# Patient Record
Sex: Female | Born: 1950
Health system: Southern US, Community
[De-identification: ages and names within clinical notes are randomized; demographics above are authoritative.]

## PROBLEM LIST (undated history)

## (undated) DIAGNOSIS — I10 Essential (primary) hypertension: Secondary | ICD-10-CM

## (undated) DIAGNOSIS — K219 Gastro-esophageal reflux disease without esophagitis: Secondary | ICD-10-CM

## (undated) DIAGNOSIS — E876 Hypokalemia: Secondary | ICD-10-CM

---

## 2000-01-15 ENCOUNTER — Emergency Department (HOSPITAL_COMMUNITY): Admission: EM | Admit: 2000-01-15 | Discharge: 2000-01-15 | Payer: Self-pay | Admitting: Emergency Medicine

## 2000-08-12 ENCOUNTER — Other Ambulatory Visit: Admission: RE | Admit: 2000-08-12 | Discharge: 2000-08-12 | Payer: Self-pay | Admitting: *Deleted

## 2005-03-13 ENCOUNTER — Observation Stay (HOSPITAL_COMMUNITY): Admission: EM | Admit: 2005-03-13 | Discharge: 2005-03-14 | Payer: Self-pay | Admitting: Emergency Medicine

## 2005-04-17 ENCOUNTER — Encounter: Admission: RE | Admit: 2005-04-17 | Discharge: 2005-05-19 | Payer: Self-pay | Admitting: Orthopedic Surgery

## 2005-06-01 ENCOUNTER — Encounter: Admission: RE | Admit: 2005-06-01 | Discharge: 2005-06-18 | Payer: Self-pay | Admitting: Orthopedic Surgery

## 2006-08-20 ENCOUNTER — Inpatient Hospital Stay (HOSPITAL_COMMUNITY): Admission: AD | Admit: 2006-08-20 | Discharge: 2006-08-20 | Payer: Self-pay | Admitting: Obstetrics & Gynecology

## 2006-09-06 ENCOUNTER — Ambulatory Visit: Payer: Self-pay | Admitting: Internal Medicine

## 2006-09-21 ENCOUNTER — Emergency Department (HOSPITAL_COMMUNITY): Admission: EM | Admit: 2006-09-21 | Discharge: 2006-09-21 | Payer: Self-pay | Admitting: Emergency Medicine

## 2007-08-10 ENCOUNTER — Emergency Department (HOSPITAL_COMMUNITY): Admission: EM | Admit: 2007-08-10 | Discharge: 2007-08-10 | Payer: Self-pay | Admitting: Emergency Medicine

## 2007-09-22 ENCOUNTER — Emergency Department (HOSPITAL_COMMUNITY): Admission: EM | Admit: 2007-09-22 | Discharge: 2007-09-22 | Payer: Self-pay | Admitting: Family Medicine

## 2007-11-01 ENCOUNTER — Emergency Department (HOSPITAL_COMMUNITY): Admission: EM | Admit: 2007-11-01 | Discharge: 2007-11-01 | Payer: Self-pay | Admitting: Emergency Medicine

## 2008-05-23 ENCOUNTER — Emergency Department (HOSPITAL_COMMUNITY): Admission: EM | Admit: 2008-05-23 | Discharge: 2008-05-23 | Payer: Self-pay | Admitting: Orthopaedic Surgery

## 2008-06-05 ENCOUNTER — Encounter (INDEPENDENT_AMBULATORY_CARE_PROVIDER_SITE_OTHER): Payer: Self-pay | Admitting: Adult Health

## 2008-06-05 ENCOUNTER — Ambulatory Visit: Payer: Self-pay | Admitting: Internal Medicine

## 2008-06-05 LAB — CONVERTED CEMR LAB
Alkaline Phosphatase: 55 units/L (ref 39–117)
BUN: 11 mg/dL (ref 6–23)
Cholesterol: 176 mg/dL (ref 0–200)
Creatinine, Ser: 0.7 mg/dL (ref 0.40–1.20)
Eosinophils Absolute: 0.1 10*3/uL (ref 0.0–0.7)
Folate: 9.5 ng/mL
Glucose, Bld: 80 mg/dL (ref 70–99)
HCT: 37.1 % (ref 36.0–46.0)
Hemoglobin: 11.5 g/dL — ABNORMAL LOW (ref 12.0–15.0)
Iron: 169 ug/dL — ABNORMAL HIGH (ref 42–145)
LDL Cholesterol: 80 mg/dL (ref 0–99)
Lymphocytes Relative: 27 % (ref 12–46)
MCHC: 31 g/dL (ref 30.0–36.0)
MCV: 98.1 fL (ref 78.0–100.0)
Monocytes Absolute: 0.4 10*3/uL (ref 0.1–1.0)
Platelets: 312 10*3/uL (ref 150–400)
Potassium: 4.4 meq/L (ref 3.5–5.3)
RBC: 3.78 M/uL — ABNORMAL LOW (ref 3.87–5.11)
Saturation Ratios: 48 % (ref 20–55)
Sodium: 143 meq/L (ref 135–145)
TIBC: 354 ug/dL (ref 250–470)
TSH: 1.063 microintl units/mL (ref 0.350–4.500)
Total Bilirubin: 0.3 mg/dL (ref 0.3–1.2)
Triglycerides: 146 mg/dL (ref <150)

## 2008-06-15 ENCOUNTER — Ambulatory Visit: Payer: Self-pay | Admitting: *Deleted

## 2008-07-11 ENCOUNTER — Ambulatory Visit: Payer: Self-pay | Admitting: Internal Medicine

## 2008-07-11 ENCOUNTER — Encounter (INDEPENDENT_AMBULATORY_CARE_PROVIDER_SITE_OTHER): Payer: Self-pay | Admitting: Adult Health

## 2008-07-18 ENCOUNTER — Ambulatory Visit (HOSPITAL_COMMUNITY): Admission: RE | Admit: 2008-07-18 | Discharge: 2008-07-18 | Payer: Self-pay | Admitting: Family Medicine

## 2008-08-10 ENCOUNTER — Emergency Department (HOSPITAL_COMMUNITY): Admission: EM | Admit: 2008-08-10 | Discharge: 2008-08-10 | Payer: Self-pay | Admitting: Emergency Medicine

## 2008-08-14 ENCOUNTER — Ambulatory Visit: Payer: Self-pay | Admitting: Internal Medicine

## 2008-08-15 ENCOUNTER — Ambulatory Visit: Payer: Self-pay | Admitting: Internal Medicine

## 2009-02-06 ENCOUNTER — Encounter (INDEPENDENT_AMBULATORY_CARE_PROVIDER_SITE_OTHER): Payer: Self-pay | Admitting: Adult Health

## 2009-02-06 ENCOUNTER — Ambulatory Visit: Payer: Self-pay | Admitting: Internal Medicine

## 2009-02-06 LAB — CONVERTED CEMR LAB
Alkaline Phosphatase: 59 units/L (ref 39–117)
BUN: 10 mg/dL (ref 6–23)
Basophils Absolute: 0 10*3/uL (ref 0.0–0.1)
Basophils Relative: 1 % (ref 0–1)
Chloride: 101 meq/L (ref 96–112)
Eosinophils Relative: 3 % (ref 0–5)
Glucose, Bld: 65 mg/dL — ABNORMAL LOW (ref 70–99)
Hemoglobin: 12.6 g/dL (ref 12.0–15.0)
Lymphocytes Relative: 30 % (ref 12–46)
Monocytes Absolute: 0.4 10*3/uL (ref 0.1–1.0)
Monocytes Relative: 9 % (ref 3–12)
Neutrophils Relative %: 58 % (ref 43–77)
RBC: 4.11 M/uL (ref 3.87–5.11)
RDW: 15 % (ref 11.5–15.5)
Total Bilirubin: 0.6 mg/dL (ref 0.3–1.2)
Total Protein: 8 g/dL (ref 6.0–8.3)

## 2009-02-08 ENCOUNTER — Encounter (INDEPENDENT_AMBULATORY_CARE_PROVIDER_SITE_OTHER): Payer: Self-pay | Admitting: Adult Health

## 2009-02-08 LAB — CONVERTED CEMR LAB
HCV Ab: NEGATIVE
Hep A IgM: NEGATIVE
Hep B C IgM: NEGATIVE
Hepatitis B Surface Ag: NEGATIVE

## 2009-05-22 ENCOUNTER — Ambulatory Visit: Payer: Self-pay | Admitting: Internal Medicine

## 2009-07-03 ENCOUNTER — Ambulatory Visit: Payer: Self-pay | Admitting: Internal Medicine

## 2009-07-03 ENCOUNTER — Encounter (INDEPENDENT_AMBULATORY_CARE_PROVIDER_SITE_OTHER): Payer: Self-pay | Admitting: Adult Health

## 2009-07-03 LAB — CONVERTED CEMR LAB
ALT: 15 units/L (ref 0–35)
AST: 24 units/L (ref 0–37)
Albumin: 4.8 g/dL (ref 3.5–5.2)
Basophils Absolute: 0 10*3/uL (ref 0.0–0.1)
Basophils Relative: 1 % (ref 0–1)
Cholesterol: 187 mg/dL (ref 0–200)
Eosinophils Absolute: 0 10*3/uL (ref 0.0–0.7)
Eosinophils Relative: 1 % (ref 0–5)
HCT: 35.9 % — ABNORMAL LOW (ref 36.0–46.0)
HDL: 68 mg/dL (ref >39)
Hemoglobin: 11.8 g/dL — ABNORMAL LOW (ref 12.0–15.0)
MCV: 92.8 fL (ref 78.0–100.0)
Monocytes Absolute: 0.2 10*3/uL (ref 0.1–1.0)
Monocytes Relative: 5 % (ref 3–12)
Neutrophils Relative %: 66 % (ref 43–77)
Platelets: 211 10*3/uL (ref 150–400)
Sodium: 138 meq/L (ref 135–145)
Total CHOL/HDL Ratio: 2.8
Total Protein: 7.9 g/dL (ref 6.0–8.3)
VLDL: 23 mg/dL (ref 0–40)

## 2009-07-08 ENCOUNTER — Encounter (INDEPENDENT_AMBULATORY_CARE_PROVIDER_SITE_OTHER): Payer: Self-pay | Admitting: Adult Health

## 2009-07-08 LAB — CONVERTED CEMR LAB: Iron: 136 ug/dL (ref 42–145)

## 2009-10-07 ENCOUNTER — Emergency Department (HOSPITAL_COMMUNITY): Admission: EM | Admit: 2009-10-07 | Discharge: 2009-10-07 | Payer: Self-pay | Admitting: Family Medicine

## 2009-10-07 ENCOUNTER — Emergency Department (HOSPITAL_COMMUNITY): Admission: EM | Admit: 2009-10-07 | Discharge: 2009-10-07 | Payer: Self-pay | Admitting: Emergency Medicine

## 2010-05-23 LAB — URINALYSIS, ROUTINE W REFLEX MICROSCOPIC
Bilirubin Urine: NEGATIVE
Glucose, UA: NEGATIVE mg/dL
Hgb urine dipstick: NEGATIVE
Ketones, ur: NEGATIVE mg/dL
Nitrite: NEGATIVE
Protein, ur: NEGATIVE mg/dL
Specific Gravity, Urine: 1.006 (ref 1.005–1.030)
Urobilinogen, UA: 0.2 mg/dL (ref 0.0–1.0)
pH: 5.5 (ref 5.0–8.0)

## 2010-05-23 LAB — CBC
HCT: 31.4 % — ABNORMAL LOW (ref 36.0–46.0)
Hemoglobin: 10.8 g/dL — ABNORMAL LOW (ref 12.0–15.0)
MCH: 32.1 pg (ref 26.0–34.0)
MCHC: 34.4 g/dL (ref 30.0–36.0)
MCV: 93.5 fL (ref 78.0–100.0)
Platelets: 217 K/uL (ref 150–400)
RBC: 3.36 MIL/uL — ABNORMAL LOW (ref 3.87–5.11)
RDW: 13.8 % (ref 11.5–15.5)
WBC: 5.3 K/uL (ref 4.0–10.5)

## 2010-05-23 LAB — BASIC METABOLIC PANEL
BUN: 33 mg/dL — ABNORMAL HIGH (ref 6–23)
CO2: 28 mEq/L (ref 19–32)
Calcium: 10.3 mg/dL (ref 8.4–10.5)
Chloride: 97 mEq/L (ref 96–112)
Creatinine, Ser: 2.37 mg/dL — ABNORMAL HIGH (ref 0.4–1.2)

## 2010-05-23 LAB — DIFFERENTIAL
Eosinophils Absolute: 0.2 10*3/uL (ref 0.0–0.7)
Lymphocytes Relative: 28 % (ref 12–46)
Lymphs Abs: 1.5 10*3/uL (ref 0.7–4.0)
Monocytes Relative: 8 % (ref 3–12)
Neutro Abs: 3.2 10*3/uL (ref 1.7–7.7)
Neutrophils Relative %: 61 % (ref 43–77)

## 2010-05-23 LAB — POCT I-STAT, CHEM 8
BUN: 36 mg/dL — ABNORMAL HIGH (ref 6–23)
Calcium, Ion: 1.19 mmol/L (ref 1.12–1.32)
Chloride: 97 meq/L (ref 96–112)
Creatinine, Ser: 2.8 mg/dL — ABNORMAL HIGH (ref 0.4–1.2)
Glucose, Bld: 75 mg/dL (ref 70–99)
HCT: 35 % — ABNORMAL LOW (ref 36.0–46.0)
Hemoglobin: 11.9 g/dL — ABNORMAL LOW (ref 12.0–15.0)
Potassium: 3 meq/L — ABNORMAL LOW (ref 3.5–5.1)
Sodium: 134 meq/L — ABNORMAL LOW (ref 135–145)
TCO2: 27 mmol/L (ref 0–100)

## 2010-05-23 LAB — POCT CARDIAC MARKERS
CKMB, poc: <1 ng/mL — ABNORMAL LOW (ref 1.0–8.0)
Myoglobin, poc: 94.2 ng/mL (ref 12–200)
Troponin i, poc: <0.05 ng/mL (ref 0.00–0.09)

## 2010-06-16 LAB — BASIC METABOLIC PANEL
CO2: 23 mEq/L (ref 19–32)
Chloride: 102 mEq/L (ref 96–112)
GFR calc Af Amer: >60 mL/min (ref >60)
Potassium: 3.1 mEq/L — ABNORMAL LOW (ref 3.5–5.1)

## 2010-06-19 LAB — URINALYSIS, ROUTINE W REFLEX MICROSCOPIC
Bilirubin Urine: NEGATIVE
Glucose, UA: NEGATIVE mg/dL
Hgb urine dipstick: NEGATIVE
Ketones, ur: NEGATIVE mg/dL
Nitrite: NEGATIVE
Urobilinogen, UA: 1 mg/dL (ref 0.0–1.0)

## 2010-06-19 LAB — BASIC METABOLIC PANEL
BUN: 8 mg/dL (ref 6–23)
Calcium: 8.8 mg/dL (ref 8.4–10.5)
Creatinine, Ser: 0.65 mg/dL (ref 0.4–1.2)
GFR calc Af Amer: >60 mL/min (ref >60)
GFR calc non Af Amer: >60 mL/min (ref >60)
Glucose, Bld: 102 mg/dL — ABNORMAL HIGH (ref 70–99)
Sodium: 139 mEq/L (ref 135–145)

## 2010-06-19 LAB — DIFFERENTIAL
Eosinophils Relative: 0 % (ref 0–5)
Lymphocytes Relative: 10 % — ABNORMAL LOW (ref 12–46)
Lymphs Abs: 0.5 10*3/uL — ABNORMAL LOW (ref 0.7–4.0)
Monocytes Absolute: 0.3 10*3/uL (ref 0.1–1.0)
Monocytes Relative: 6 % (ref 3–12)
Neutro Abs: 3.8 10*3/uL (ref 1.7–7.7)
Neutrophils Relative %: 83 % — ABNORMAL HIGH (ref 43–77)

## 2010-06-19 LAB — CBC
HCT: 33.2 % — ABNORMAL LOW (ref 36.0–46.0)
RDW: 13.6 % (ref 11.5–15.5)
WBC: 4.5 10*3/uL (ref 4.0–10.5)

## 2010-06-19 LAB — URINE MICROSCOPIC-ADD ON
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
## 2010-08-11 ENCOUNTER — Inpatient Hospital Stay (INDEPENDENT_AMBULATORY_CARE_PROVIDER_SITE_OTHER)
Admission: RE | Admit: 2010-08-11 | Discharge: 2010-08-11 | Disposition: A | Discharge status: Home / Self Care | Payer: Self-pay | Source: Ambulatory Visit | Attending: Family Medicine | Admitting: Family Medicine

## 2010-08-11 DIAGNOSIS — Z760 Encounter for issue of repeat prescription: Secondary | ICD-10-CM

## 2010-08-11 DIAGNOSIS — I10 Essential (primary) hypertension: Secondary | ICD-10-CM

## 2010-08-11 DIAGNOSIS — L2089 Other atopic dermatitis: Secondary | ICD-10-CM

## 2010-08-11 LAB — POCT I-STAT, CHEM 8
BUN: 13 mg/dL (ref 6–23)
Calcium, Ion: 1.3 mmol/L (ref 1.12–1.32)
Chloride: 107 mEq/L (ref 96–112)
Creatinine, Ser: 0.9 mg/dL (ref 0.4–1.2)
Hemoglobin: 13.9 g/dL (ref 12.0–15.0)

## 2010-12-25 ENCOUNTER — Emergency Department (HOSPITAL_COMMUNITY)
Admission: EM | Admit: 2010-12-25 | Discharge: 2010-12-26 | Disposition: A | Discharge status: Home / Self Care | Payer: Self-pay | Attending: Emergency Medicine | Admitting: Emergency Medicine

## 2010-12-25 DIAGNOSIS — I10 Essential (primary) hypertension: Secondary | ICD-10-CM | POA: Insufficient documentation

## 2010-12-25 DIAGNOSIS — J449 Chronic obstructive pulmonary disease, unspecified: Secondary | ICD-10-CM | POA: Insufficient documentation

## 2010-12-25 DIAGNOSIS — R21 Rash and other nonspecific skin eruption: Secondary | ICD-10-CM | POA: Insufficient documentation

## 2010-12-25 DIAGNOSIS — J4489 Other specified chronic obstructive pulmonary disease: Secondary | ICD-10-CM | POA: Insufficient documentation

## 2010-12-25 DIAGNOSIS — K219 Gastro-esophageal reflux disease without esophagitis: Secondary | ICD-10-CM | POA: Insufficient documentation

## 2010-12-25 LAB — COMPREHENSIVE METABOLIC PANEL
ALT: 28
AST: 56 — ABNORMAL HIGH
Alkaline Phosphatase: 62
CO2: 24
Chloride: 104
GFR calc Af Amer: >60
GFR calc non Af Amer: >60
Glucose, Bld: 199 — ABNORMAL HIGH
Potassium: 3.3 — ABNORMAL LOW
Sodium: 136
Total Bilirubin: 0.8

## 2010-12-25 LAB — CBC
Hemoglobin: 12.2
MCHC: 33.7
RBC: 3.89
WBC: 5.2

## 2010-12-25 LAB — URINALYSIS, ROUTINE W REFLEX MICROSCOPIC
Glucose, UA: 250 — AB
Hgb urine dipstick: NEGATIVE
Ketones, ur: 15 — AB
Nitrite: NEGATIVE
Protein, ur: NEGATIVE
Specific Gravity, Urine: 1.02
Urobilinogen, UA: 1
pH: 6

## 2010-12-25 LAB — GC/CHLAMYDIA PROBE AMP, GENITAL
Chlamydia, DNA Probe: NEGATIVE
GC Probe Amp, Genital: NEGATIVE

## 2010-12-25 LAB — ETHANOL: Alcohol, Ethyl (B): <5

## 2010-12-25 LAB — WET PREP, GENITAL: Trich, Wet Prep: NONE SEEN

## 2010-12-25 LAB — POCT PREGNANCY, URINE: Preg Test, Ur: NEGATIVE

## 2011-01-24 ENCOUNTER — Emergency Department (HOSPITAL_COMMUNITY)
Admission: EM | Admit: 2011-01-24 | Discharge: 2011-01-24 | Disposition: A | Discharge status: Home / Self Care | Payer: Self-pay | Attending: Emergency Medicine | Admitting: Emergency Medicine

## 2011-01-24 ENCOUNTER — Encounter: Payer: Self-pay | Admitting: *Deleted

## 2011-01-24 DIAGNOSIS — K219 Gastro-esophageal reflux disease without esophagitis: Secondary | ICD-10-CM | POA: Insufficient documentation

## 2011-01-24 DIAGNOSIS — R51 Headache: Secondary | ICD-10-CM | POA: Insufficient documentation

## 2011-01-24 DIAGNOSIS — I10 Essential (primary) hypertension: Secondary | ICD-10-CM | POA: Insufficient documentation

## 2011-01-24 HISTORY — DX: Essential (primary) hypertension: I10

## 2011-01-24 HISTORY — DX: Hypokalemia: E87.6

## 2011-01-24 HISTORY — DX: Gastro-esophageal reflux disease without esophagitis: K21.9

## 2011-01-24 MED ORDER — IBUPROFEN 800 MG PO TABS
800.0000 mg | ORAL_TABLET | Freq: Once | ORAL | Status: AC
  Administered 2011-01-24: 800 mg via ORAL
  Filled 2011-01-24: qty 1

## 2011-01-24 MED ORDER — LISINOPRIL 10 MG PO TABS: 10.0000 mg | ORAL_TABLET | Freq: Every day | ORAL | Status: DC

## 2011-01-24 NOTE — ED Notes (Signed)
Pt went to pharmacy today, had bp checked, 150/98 has had headache for a few days, pt states she has not been taking her meds due to running out and needs a prescription for meds

## 2011-01-24 NOTE — ED Provider Notes (Signed)
History     CSN: [id number] Arrival date & time: 01/24/2011  4:31 PM   First MD Initiated Contact with Patient 01/24/11 1646      Chief Complaint  Patient presents with  . Hypertension    (Consider location/radiation/quality/duration/timing/severity/associated sxs/prior treatment) HPI The patient presents with concerns over hypertension. She also complains of a headache. She notes that she has been infrequently using her prescribed medications, do to the lack of access to a primary care physician, and no remaining tablets. She notes that for the past 2 days she has had a headache. Her headache began gradually, since onset has been persistent. It is global, probably. No photophobia, no nausea, no disorientation, no ataxia. The patient has a long history of headaches, notes that today's headache is consistent with innumerable prior headaches, with no distinguishing features.  She associates prolonged headaches with hypertension. Today following the persistency of her pain, she checked her blood pressure and found her systolic reading to be 150. She also this is considerably down from her typical 190.  Past Medical History  Diagnosis Date  . Hypertension   . Hypokalemia   . GERD (gastroesophageal reflux disease)     History reviewed. No pertinent past surgical history.  No family history on file.  History  Substance Use Topics  . Smoking status: Never Smoker   . Smokeless tobacco: Current User  . Alcohol Use: Yes    OB History    Grav Para Term Preterm Abortions TAB SAB Ect Mult Living                  Review of Systems  Constitutional: Negative.   HENT: Negative for trouble swallowing and neck pain.   Eyes: Negative.   Respiratory: Negative.   Cardiovascular: Negative.   Gastrointestinal: Negative.   Genitourinary: Negative.   Musculoskeletal: Negative.   Skin: Negative.   Neurological: Positive for headaches.  Hematological: Negative.   Psychiatric/Behavioral:  Negative.     Allergies  Aspirin  Home Medications   Current Outpatient Rx  Name Route Sig Dispense Refill  . FAMOTIDINE 20 MG PO TABS Oral Take 20 mg by mouth 2 (two) times daily.      Marland Kitchen LISINOPRIL 10 MG PO TABS Oral Take 10 mg by mouth daily.      Marland Kitchen DIPHENHYDRAMINE HCL 25 MG PO TABS Oral Take 25 mg by mouth every 6 (six) hours as needed. Rash itching     . THIAMINE HCL 100 MG PO TABS Oral Take 100 mg by mouth 2 (two) times daily.        BP 159/89  Pulse 67  Temp(Src) 98.6 F (37 C) (Oral)  Resp 18  Wt 101 lb (45.813 kg)  Physical Exam  Constitutional: She is oriented to person, place, and time. She appears well-developed and well-nourished.  HENT:  Head: Normocephalic and atraumatic.  Eyes: EOM are normal.  Cardiovascular: Normal rate and regular rhythm.   Pulmonary/Chest: Effort normal and breath sounds normal.  Abdominal: She exhibits no distension.  Musculoskeletal: She exhibits no edema and no tenderness.  Neurological: She is alert and oriented to person, place, and time. She has normal strength. She is not disoriented. She displays no tremor. No cranial nerve deficit or sensory deficit. She exhibits normal muscle tone. She displays no seizure activity. Gait normal.  Skin: Skin is warm and dry.    ED Course  Procedures (including critical care time)  Labs Reviewed - No data to display No results found.  No diagnosis found.    MDM  This generally well-appearing female presents with concerns over headache and hypertension. Notably the patient has not been compliant with her medications do to monetary concerns. On exam the patient is in no distress has no notable physical exam findings, and when asked how we could be of most assistance to her she replies that a new perception for her lisinopril would be appreciated.        Gerhard Munch, MD 01/24/11 [phone]

## 2013-10-12 ENCOUNTER — Encounter (HOSPITAL_COMMUNITY): Payer: Self-pay | Admitting: Emergency Medicine

## 2013-10-12 ENCOUNTER — Emergency Department (HOSPITAL_COMMUNITY)
Admission: EM | Admit: 2013-10-12 | Discharge: 2013-10-12 | Disposition: A | Discharge status: Home / Self Care | Payer: No Typology Code available for payment source | Attending: Emergency Medicine | Admitting: Emergency Medicine

## 2013-10-12 DIAGNOSIS — K219 Gastro-esophageal reflux disease without esophagitis: Secondary | ICD-10-CM | POA: Insufficient documentation

## 2013-10-12 DIAGNOSIS — Z792 Long term (current) use of antibiotics: Secondary | ICD-10-CM | POA: Insufficient documentation

## 2013-10-12 DIAGNOSIS — R21 Rash and other nonspecific skin eruption: Secondary | ICD-10-CM | POA: Insufficient documentation

## 2013-10-12 DIAGNOSIS — IMO0002 Reserved for concepts with insufficient information to code with codable children: Secondary | ICD-10-CM | POA: Insufficient documentation

## 2013-10-12 DIAGNOSIS — Z79899 Other long term (current) drug therapy: Secondary | ICD-10-CM | POA: Insufficient documentation

## 2013-10-12 DIAGNOSIS — Z8639 Personal history of other endocrine, nutritional and metabolic disease: Secondary | ICD-10-CM | POA: Insufficient documentation

## 2013-10-12 DIAGNOSIS — L988 Other specified disorders of the skin and subcutaneous tissue: Secondary | ICD-10-CM | POA: Insufficient documentation

## 2013-10-12 DIAGNOSIS — Z862 Personal history of diseases of the blood and blood-forming organs and certain disorders involving the immune mechanism: Secondary | ICD-10-CM | POA: Insufficient documentation

## 2013-10-12 DIAGNOSIS — I10 Essential (primary) hypertension: Secondary | ICD-10-CM | POA: Insufficient documentation

## 2013-10-12 DIAGNOSIS — R238 Other skin changes: Secondary | ICD-10-CM

## 2013-10-12 MED ORDER — PREDNISONE 10 MG PO TABS: 20.0000 mg | ORAL_TABLET | Freq: Every day | ORAL | Status: DC

## 2013-10-12 MED ORDER — CEPHALEXIN 500 MG PO CAPS: 500.0000 mg | ORAL_CAPSULE | Freq: Four times a day (QID) | ORAL | Status: DC

## 2013-10-12 MED ORDER — LISINOPRIL 10 MG PO TABS: 10.0000 mg | ORAL_TABLET | Freq: Every day | ORAL | Status: DC

## 2013-10-12 NOTE — ED Notes (Signed)
Pt reports rash to head and ears from using a home hair growth product. Pt reports wound to scalp with drainage. Pt awake, alert, oriented x4, NAD at present.

## 2013-10-12 NOTE — ED Provider Notes (Signed)
CSN: [id number]     Arrival date & time 10/12/13  1310 History  This chart was scribed for Delos Haring, PA-C, working with Evelina Bucy, MD by Girtha Hake, ED Scribe. The patient was seen in TR07C/TR07C. The patient's care was started at 2:38 PM.   Chief Complaint  Patient presents with  . Rash   Patient is a 63 y.o. female presenting with rash. The history is provided by the patient. No language interpreter was used.  Rash   HPI Comments: Crystal Phillips is a 63 y.o. female who presents to the Emergency Department complaining of a rash on her neck, ears, and scalp beginning 3 days ago. Patient reports associated drainage. She states that she began using a home hair growth product on July 7, and believes the rash to be associated with this. Patient reports that she is allergic to Aspirin. She has not had any fevers, global weakness, n/v/d.  Patient does not have a PCP.   Past Medical History  Diagnosis Date  . Hypertension   . Hypokalemia   . GERD (gastroesophageal reflux disease)    History reviewed. No pertinent past surgical history. History reviewed. No pertinent family history. History  Substance Use Topics  . Smoking status: Never Smoker   . Smokeless tobacco: Current User  . Alcohol Use: Yes   OB History   Grav Para Term Preterm Abortions TAB SAB Ect Mult Living                 Review of Systems  Skin: Positive for rash.  All other systems reviewed and are negative.     Allergies  Aspirin  Home Medications   Prior to Admission medications   Medication Sig Start Date End Date Taking? Authorizing Provider  cephALEXin (KEFLEX) 500 MG capsule Take 1 capsule (500 mg total) by mouth 4 (four) times daily. 10/12/13   Wanette Robison Marilu Favre, PA-C  diphenhydrAMINE (BENADRYL) 25 MG tablet Take 25 mg by mouth every 6 (six) hours as needed. Rash itching     Historical Provider, MD  famotidine (PEPCID) 20 MG tablet Take 20 mg by mouth 2 (two) times daily.      Historical  Provider, MD  lisinopril (PRINIVIL,ZESTRIL) 10 MG tablet Take 1 tablet (10 mg total) by mouth daily. 10/12/13   Daisy Mcneel Marilu Favre, PA-C  predniSONE (DELTASONE) 10 MG tablet Take 2 tablets (20 mg total) by mouth daily. 10/12/13   Rosebud Koenen Marilu Favre, PA-C  thiamine 100 MG tablet Take 100 mg by mouth 2 (two) times daily.      Historical Provider, MD    Triage Vitals: BP 171/106  Pulse 88  Temp(Src) 98.6 F (37 C) (Oral)  Resp 18  SpO2 97% Physical Exam  Nursing note and vitals reviewed. Constitutional: She is oriented to person, place, and time. She appears well-developed and well-nourished. No distress.  HENT:  Head: Normocephalic and atraumatic.  Eyes: Conjunctivae and EOM are normal.  Neck: Neck supple. No tracheal deviation present.  Cardiovascular: Normal rate.   Pulmonary/Chest: Effort normal. No respiratory distress.  Musculoskeletal: Normal range of motion.  Neurological: She is alert and oriented to person, place, and time.  Skin: Skin is warm and dry. Rash noted.  Eczema to bilateral posterior neck. Areas of drainage with flaky, dry skin associated to entire scalp. Mildly tender to palpation.   No drain able abscesses  Psychiatric: She has a normal mood and affect. Her behavior is normal.    ED Course  Procedures (including critical  care time) DIAGNOSTIC STUDIES: Oxygen Saturation is 97% on room air, normal by my interpretation.    COORDINATION OF CARE: 2:45 PM-Discussed treatment plan which includes Keflex and Deltasone with pt at bedside and pt agreed to plan.     Labs Review Labs Reviewed - No data to display  Imaging Review No results found.   EKG Interpretation None      MDM   Final diagnoses:  Scalp irritation   No systemic symptoms. Referral to dermatology  lisinopril (PRINIVIL,ZESTRIL) 10 MG tablet Take 1 tablet (10 mg total) by mouth daily. 30 tablet Linus Mako, PA-C   predniSONE (DELTASONE) 10 MG tablet Take 2 tablets (20 mg total) by mouth  daily. 21 tablet Vincent Ehrler Marilu Favre, PA-C  cephALEXin (KEFLEX) 500 MG capsule Take 1 capsule (500 mg total) by mouth 4 (four) times daily. 28 capsule Linus Mako, PA-C  63 y.o.Lorenda Salahuddin's evaluation in the Emergency Department is complete. It has been determined that no acute conditions requiring further emergency intervention are present at this time. The patient/guardian have been advised of the diagnosis and plan. We have discussed signs and symptoms that warrant return to the ED, such as changes or worsening in symptoms.  Vital signs are stable at discharge. Filed Vitals:   10/12/13 1453  BP: 171/94  Pulse: 62  Temp:   Resp: 16    Patient/guardian has voiced understanding and agreed to follow-up with the PCP or specialist.  I personally performed the services described in this documentation, which was scribed in my presence. The recorded information has been reviewed and is accurate.    Linus Mako, PA-C 10/13/13 1540

## 2013-10-12 NOTE — Discharge Instructions (Signed)

## 2013-10-12 NOTE — ED Notes (Signed)
Pts vitals updated pt awaiting discharge paper work at bedside

## 2013-10-12 NOTE — ED Notes (Addendum)
Pt comfortable with discharge and follow up instructions. Prescriptions x2. Pt declines wheelchair.

## 2013-10-12 NOTE — Progress Notes (Addendum)
ED CM received call from [place] for a prescription clarification. ED CM clarified from AVS in Epic.

## 2013-10-14 NOTE — ED Provider Notes (Signed)
Medical screening examination/treatment/procedure(s) were performed by non-physician practitioner and as supervising physician I was immediately available for consultation/collaboration.   EKG Interpretation None        Evelina Bucy, MD 10/14/13 [phone]

## 2014-03-29 ENCOUNTER — Ambulatory Visit (INDEPENDENT_AMBULATORY_CARE_PROVIDER_SITE_OTHER): Payer: No Typology Code available for payment source | Admitting: Internal Medicine

## 2014-03-29 ENCOUNTER — Encounter: Payer: Self-pay | Admitting: Internal Medicine

## 2014-03-29 VITALS — BP 180/108 | HR 85 | Temp 98.6°F | Ht 62.5 in | Wt 100.0 lb

## 2014-03-29 DIAGNOSIS — I10 Essential (primary) hypertension: Secondary | ICD-10-CM

## 2014-03-29 DIAGNOSIS — R0600 Dyspnea, unspecified: Secondary | ICD-10-CM

## 2014-03-29 MED ORDER — CLONIDINE HCL 0.1 MG PO TABS: 0.1000 mg | ORAL_TABLET | Freq: Two times a day (BID) | ORAL | Status: AC

## 2014-03-29 NOTE — Progress Notes (Signed)
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     03/29/2014 1st [facility] office visit/ Crystal Phillips / still actively smoking with nl pfts  Chief Complaint  Patient presents with  . Pulmonary Consult    Self referral. Pt states that she has had SOB "for years and years" she states "did notice until I could'nt blow into the interlock system".  She states that that she feels SOB when she is cleaning and working with cleaning chemicals.    on exp to chemicals has runny eyes/ coughing esp bleach in bathrooms - otherwise no cough or sob  No obvious other patterns in day to day or daytime variabilty or assoc chronic cough or cp or chest tightness, subjective wheeze overt sinus or hb symptoms. No unusual exp hx or h/o childhood pna/ asthma or knowledge of premature birth.  Sleeping ok without nocturnal  or early am exacerbation  of respiratory  c/o's or need for noct saba. Also denies any obvious fluctuation of symptoms with weather or environmental changes or other aggravating or alleviating factors except as outlined above   Current Medications, Allergies, Complete Past Medical History, Past Surgical History, Family History, and Social History were reviewed in [employer] record.           Review of Systems  Constitutional: Negative for fever, chills and unexpected weight change.  HENT: Negative for congestion, dental problem, ear pain, nosebleeds, postnasal drip, rhinorrhea, sinus pressure, sneezing, sore throat, trouble swallowing and voice change.   Eyes: Negative for visual disturbance.  Respiratory: Positive for  shortness of breath. Negative for cough and choking.   Cardiovascular: Negative for chest pain and leg swelling.  Gastrointestinal: Negative for vomiting, abdominal pain and diarrhea.  Genitourinary: Negative for difficulty urinating.  Musculoskeletal: Negative for arthralgias.  Skin: Negative for rash.  Neurological: Negative for tremors, syncope and headaches.  Hematological: Does not bruise/bleed easily.       Objective:   Physical Exam  amb bf   Wt Readings from Last 3 Encounters:  03/29/14 100 lb (45.36 kg)  01/24/11 101 lb (45.813 kg)    Vital signs reviewed  HEENT: nl dentition, turbinates, and orophanx. Nl external ear canals without cough reflex   NECK :  without JVD/Nodes/TM/ nl carotid upstrokes bilaterally   LUNGS: no acc muscle use, clear to A and P bilaterally without cough on insp or exp maneuvers   CV:  RRR  no s3 or murmur or increase in P2, no edema   ABD:  soft and nontender with nl excursion in the supine position. No bruits or organomegaly, bowel sounds nl  MS:  warm without deformities, calf tenderness, cyanosis or clubbing  SKIN: warm and dry without lesions    NEURO:  alert, approp, no deficits    CXR PA and Lateral:   03/29/2014 :     I personally reviewed images and agree with radiology impression as follows:    declined cxr / labs      Assessment & Plan:

## 2014-03-29 NOTE — Patient Instructions (Signed)
Please remember to go to the lab and x-ray department downstairs for your tests - we will call you with the results when they are available.  Clonidine 0.1 twice daily   You need a primary care doctor through  [facility]  (? [place] [facility] or [place] [facility])

## 2014-03-30 NOTE — Assessment & Plan Note (Addendum)
-   spirometry 03/29/2014 > wnl   Symptoms are markedly disproportionate to objective findings and not clear this is a lung problem but pt does appear to have difficult airway management issues. DDX of  difficult airways management all start with A and  include Adherence, Ace Inhibitors, Acid Reflux, Active Sinus Disease, Alpha 1 Antitripsin deficiency, Anxiety masquerading as Airways dz,  ABPA,  allergy(esp in young), Aspiration (esp in elderly), Adverse effects of DPI,  Active smokers, plus two Bs  = Bronchiectasis and Beta blocker use..and one C= CHF  ? Anxiety usually dx of exlcusion but much higher on ddx  ?acei > has been on in past ? How recent> would avoid (see hbp a/p)  rec cxr / labs but declined   Don't see any reason she couldn't use a flow or vol based ignition interlock unit consistently

## 2014-03-30 NOTE — Assessment & Plan Note (Addendum)
Anxiety/ cig use/ etohism may all be contributing, rec trial of clonidine 0.1 mg bid and f/u primary care   Note has been on acei previously but not a good choice in pt with unexplained resp symptoms  ACE inhibitors are problematic in  pts with airway complaints because  even experienced pulmonologists can't always distinguish ace effects from copd/asthma.  By themselves they don't actually cause a problem, much like oxygen can't by itself start a fire, but they certainly serve as a powerful catalyst or enhancer for any "fire"  or inflammatory process in the upper airway, be it caused by an ET  tube or more commonly reflux (especially in the obese or pts with known GERD or who are on biphoshonates).    it just makes sense to avoid ACEI  entirely  In this setting, esp with better alternatives like clonidine which might help her with her other cc's

## 2014-04-05 ENCOUNTER — Telehealth: Payer: Self-pay | Admitting: Internal Medicine

## 2014-04-05 NOTE — Telephone Encounter (Signed)
Pt aware that Clonidine has been sent to [facility]. Nothing further needed.

## 2014-06-05 ENCOUNTER — Encounter: Payer: Self-pay | Admitting: Gastroenterology

## 2014-06-12 ENCOUNTER — Telehealth: Payer: Self-pay | Admitting: Internal Medicine

## 2014-06-12 NOTE — Telephone Encounter (Signed)
Spoke with pt, states she wants to establish with another provider in the office.  Pt has no preference.  Pt states that she needs to have a second opinion per the DMV to try and get rid of her breathalyzer.    Pt saw MW on 03/29/14 for this.    MR are you ok with seeing this patient for this purpose?  You have a 15 minute slot at 9:15 on 4/8 with a 15 minute block after this.  Thanks!

## 2014-06-13 NOTE — Telephone Encounter (Signed)
Pt states she needs another breathing test for the dmv [phone]

## 2014-06-13 NOTE — Telephone Encounter (Signed)
Sorry I cannot

## 2014-06-13 NOTE — Telephone Encounter (Signed)
[facility] has no open 30 minute slots through April and May.  Patient is not wanting to wait until June for this.  Dr. Lenna Gilford are you ok with seeing this patient as a second opinion/spirometry?  Thanks!

## 2014-06-13 NOTE — Telephone Encounter (Signed)
Dr Annamaria Boots can we schedule second opinion with you next avail? She will need spiro done for [facility]  Please advise thanks

## 2014-06-13 NOTE — Telephone Encounter (Signed)
Ok when routine 30 min slot available

## 2014-06-13 NOTE — Telephone Encounter (Signed)
Per SN: pt's breathing tests were normal and do not indicate any reasoning for pt to not be able to use her breathalyzer, and she needs to obtain her 2nd opinion through her PCP.   Relayed this to pt, she is aware.  Nothing further needed.

## 2014-06-29 ENCOUNTER — Other Ambulatory Visit (HOSPITAL_COMMUNITY): Payer: Self-pay | Admitting: Radiology

## 2014-06-29 ENCOUNTER — Telehealth: Payer: Self-pay | Admitting: Internal Medicine

## 2014-06-29 DIAGNOSIS — Z720 Tobacco use: Secondary | ICD-10-CM

## 2014-06-29 NOTE — Telephone Encounter (Signed)
Pt is requesting that on of our physicians (other than Dr Melvyn Novas) repeat a Spirometry or send her to [facility] PFT lab to have this done.  Per Office notes- Dr Melvyn Novas stated that her Arlyce Harman was normal and no reason as to why she cannot use the [employer] then sent to Dr Lenna Gilford to request a visit to repeat Arlyce Harman for 2nd opinion - per notes below this is Dr Jeannine Kitten rec's  Len Blalock, CMA at 06/13/2014 2:57 PM     Status: Signed       Expand All Collapse All   Per SN: pt's breathing tests were normal and do not indicate any reasoning for pt to not be able to use her breathalyzer, and she needs to obtain her 2nd opinion through her PCP.  Relayed this to pt, she is aware. Nothing further needed.        Pt states that she wants this done at [facility]. I advised her that her PCP needs to complete this and sign off as two different physicians in our practice have reviewed her studies and stated that test was normal and no need to repeat. Pt states she sees Dr Theodis Aguas in [facility] but does not want Dr Smother's to order this - wants our office to do this. I have advised her that per notes and 2 different MD's rec's she needs to contact her PCP or another physician over her care to do this. Pt instructed to contact Dr Theodis Aguas and have them complete the PFT/Spiro in office or send her to [facility] to have this done. Pt states that she has called Dr Smother's office multiple times and has not been able to get anyone to call her back- has left multiple messages to schedule an appt. Could not get out of the patient if she is a new patient to them or established--Pt became very upset and began sobbing on the phone and stated that no one wants to help her and hung up.  I called Dr Smother's office and advised them that a mutual patient has been trying to reach them and they stated that she is not an established pt to them. I advised them that she has been calling several  times and has left several voicemails and I am not sure why she is not getting a call back. The rep asked for her phone number and stated that they are going to contact her to try and get her scheduled based on the type of appt she needs.   Nothing further needed.

## 2014-07-04 ENCOUNTER — Ambulatory Visit (HOSPITAL_COMMUNITY)
Admission: RE | Admit: 2014-07-04 | Discharge: 2014-07-04 | Disposition: A | Discharge status: Home / Self Care | Payer: No Typology Code available for payment source | Source: Ambulatory Visit | Attending: Family | Admitting: Family

## 2014-07-04 DIAGNOSIS — R0609 Other forms of dyspnea: Secondary | ICD-10-CM | POA: Insufficient documentation

## 2014-07-04 DIAGNOSIS — Z720 Tobacco use: Secondary | ICD-10-CM | POA: Diagnosis not present

## 2014-07-04 LAB — SPIROMETRY WITH GRAPH
FEF 25-75 PRE: 1.92 L/s
FEF2575-%PRED-PRE: 105 %
FEV1-%Pred-Pre: 116 %
FEV1-Pre: 2.14 L
FEV1FVC-%PRED-PRE: 97 %
FEV6-%Pred-Pre: 121 %
FEV6-Pre: 2.76 L
FEV6FVC-%Pred-Pre: 104 %
FVC-%Pred-Pre: 116 %
FVC-PRE: 2.76 L
PRE FEV1/FVC RATIO: 77 %
PRE FEV6/FVC RATIO: 100 %

## 2014-07-23 ENCOUNTER — Telehealth: Payer: Self-pay | Admitting: *Deleted

## 2014-07-23 NOTE — Telephone Encounter (Signed)
Phone call to patient regarding missed previsit appointment. States her ride did not show up and she cannot depend on someone to get her to her appointments. States she will be driving next week again. Offered to reschedule previsit, however, colonoscopy is scheduled for 08/01/14. We agreed to cancel both appointments until she starts driving again and can make arrangements to have a dependable care partner with her when scheduling colonoscopy.

## 2014-08-01 ENCOUNTER — Encounter: Payer: No Typology Code available for payment source | Admitting: Gastroenterology

## 2014-09-04 ENCOUNTER — Encounter: Payer: Self-pay | Admitting: Family

## 2017-05-11 ENCOUNTER — Encounter (HOSPITAL_COMMUNITY): Payer: Self-pay | Admitting: Emergency Medicine

## 2017-05-11 ENCOUNTER — Emergency Department (HOSPITAL_COMMUNITY)
Admission: EM | Admit: 2017-05-11 | Discharge: 2017-05-12 | Disposition: A | Discharge status: Home / Self Care | Payer: Medicare Other | Attending: Emergency Medicine | Admitting: Emergency Medicine

## 2017-05-11 DIAGNOSIS — R42 Dizziness and giddiness: Secondary | ICD-10-CM | POA: Insufficient documentation

## 2017-05-11 DIAGNOSIS — I160 Hypertensive urgency: Secondary | ICD-10-CM

## 2017-05-11 DIAGNOSIS — F17210 Nicotine dependence, cigarettes, uncomplicated: Secondary | ICD-10-CM | POA: Insufficient documentation

## 2017-05-11 DIAGNOSIS — I10 Essential (primary) hypertension: Secondary | ICD-10-CM | POA: Diagnosis present

## 2017-05-11 LAB — URINALYSIS, ROUTINE W REFLEX MICROSCOPIC
Bilirubin Urine: NEGATIVE
GLUCOSE, UA: NEGATIVE mg/dL
HGB URINE DIPSTICK: NEGATIVE
Ketones, ur: 20 mg/dL — AB
Leukocytes, UA: NEGATIVE
Nitrite: NEGATIVE
PH: 6 (ref 5.0–8.0)
PROTEIN: NEGATIVE mg/dL
SPECIFIC GRAVITY, URINE: 1.018 (ref 1.005–1.030)

## 2017-05-11 MED ORDER — CLONIDINE HCL 0.1 MG PO TABS
0.2000 mg | ORAL_TABLET | Freq: Once | ORAL | Status: AC
  Administered 2017-05-12: 0.2 mg via ORAL
  Filled 2017-05-11: qty 2

## 2017-05-11 NOTE — ED Triage Notes (Addendum)
Patient sent from dentist where she was unable to have her teeth pulled due to hypertension. Reports hx of same. States she is not prescribed medication at this time. BP 167/91 in triage. Endorses dizziness, but denies chest pain, SOB, headache, and weakness.

## 2017-05-12 DIAGNOSIS — I160 Hypertensive urgency: Secondary | ICD-10-CM | POA: Diagnosis not present

## 2017-05-12 LAB — CBC WITH DIFFERENTIAL/PLATELET
BASOS ABS: 0 10*3/uL (ref 0.0–0.1)
Basophils Relative: 0 %
EOS ABS: 0.1 10*3/uL (ref 0.0–0.7)
EOS PCT: 2 %
HCT: 37.9 % (ref 36.0–46.0)
Hemoglobin: 12.5 g/dL (ref 12.0–15.0)
Lymphocytes Relative: 29 %
Lymphs Abs: 2.1 10*3/uL (ref 0.7–4.0)
MCH: 29.7 pg (ref 26.0–34.0)
MCHC: 33 g/dL (ref 30.0–36.0)
MCV: 90 fL (ref 78.0–100.0)
Monocytes Absolute: 0.4 10*3/uL (ref 0.1–1.0)
Monocytes Relative: 5 %
Neutro Abs: 4.4 10*3/uL (ref 1.7–7.7)
Neutrophils Relative %: 64 %
PLATELETS: 265 10*3/uL (ref 150–400)
RBC: 4.21 MIL/uL (ref 3.87–5.11)
RDW: 14.1 % (ref 11.5–15.5)
WBC: 7 10*3/uL (ref 4.0–10.5)

## 2017-05-12 LAB — COMPREHENSIVE METABOLIC PANEL
ALT: 14 U/L (ref 14–54)
AST: 28 U/L (ref 15–41)
Albumin: 4.6 g/dL (ref 3.5–5.0)
Alkaline Phosphatase: 65 U/L (ref 38–126)
Anion gap: 14 (ref 5–15)
BILIRUBIN TOTAL: 1 mg/dL (ref 0.3–1.2)
BUN: 9 mg/dL (ref 6–20)
CALCIUM: 9.9 mg/dL (ref 8.9–10.3)
CO2: 21 mmol/L — ABNORMAL LOW (ref 22–32)
CREATININE: 0.65 mg/dL (ref 0.44–1.00)
Chloride: 106 mmol/L (ref 101–111)
GFR calc Af Amer: >60 mL/min (ref >60)
Glucose, Bld: 77 mg/dL (ref 65–99)
Potassium: 3.3 mmol/L — ABNORMAL LOW (ref 3.5–5.1)
Sodium: 141 mmol/L (ref 135–145)
TOTAL PROTEIN: 8.4 g/dL — AB (ref 6.5–8.1)

## 2017-05-12 MED ORDER — AMLODIPINE BESYLATE 10 MG PO TABS: 10.0000 mg | ORAL_TABLET | Freq: Every day | ORAL | 0 refills | Status: AC

## 2017-05-12 NOTE — ED Provider Notes (Signed)
[facility] DEPT Provider Note   CSN: [id number] Arrival date & time: 05/11/17  1727     History   Chief Complaint Chief Complaint  Patient presents with  . Hypertension    HPI Crystal Phillips is a 67 y.o. female.  Patient is a 67 year old female with past medical history of hypertension which has been untreated for the past 5 years.  She presents today for evaluation of elevated blood pressure.  She went to the dentist today to have teeth pulled, however they would not do this procedure based on her blood pressure.  She was then referred here.  She denies to me she is experiencing any specific complaints with the exception of may be feeling slightly dizzy.  She denies any headache, weakness, numbness.  She denies any chest pain or difficulty breathing.  She had previously been on antihypertensives, however has not been on these medications for the past 5 years.  She tells me her primary doctor took her off of them and she has not seen him in this length of time.   The history is provided by the patient.  Hypertension  This is a new problem. Episode onset: This afternoon. The problem occurs constantly. The problem has not changed since onset.Pertinent negatives include no chest pain, no headaches and no shortness of breath. Nothing aggravates the symptoms. Nothing relieves the symptoms. She has tried nothing for the symptoms.    Past Medical History:  Diagnosis Date  . GERD (gastroesophageal reflux disease)   . Hypertension   . Hypokalemia     Patient Active Problem List   Diagnosis Date Noted  . Essential hypertension 03/29/2014  . Dyspnea 03/29/2014  . GERD (gastroesophageal reflux disease)     History reviewed. No pertinent surgical history.  OB History    No data available       Home Medications    Prior to Admission medications   Medication Sig Start Date End Date Taking? Authorizing Provider  cloNIDine (CATAPRES) 0.1 MG tablet  Take 1 tablet (0.1 mg total) by mouth 2 (two) times daily. 03/29/14   Tanda Rockers, MD    Family History Family History  Problem Relation Age of Onset  . COPD Brother        smokes    Social History Social History   Tobacco Use  . Smoking status: Current Some Day Smoker    Packs/day: 0.25    Years: 20.00    Pack years: 5.00    Types: Cigarettes  . Smokeless tobacco: Current User    Types: Snuff  Substance Use Topics  . Alcohol use: Yes    Alcohol/week: 0.0 oz  . Drug use: No     Allergies   Aspirin   Review of Systems Review of Systems  Respiratory: Negative for shortness of breath.   Cardiovascular: Negative for chest pain.  Neurological: Negative for headaches.  All other systems reviewed and are negative.    Physical Exam Updated Vital Signs BP (!) 216/102 (BP Location: Left Arm)   Pulse 71   Temp 98 F (36.7 C) (Oral)   Resp 18   SpO2 98%   Physical Exam  Constitutional: She is oriented to person, place, and time. She appears well-developed and well-nourished. No distress.  HENT:  Head: Normocephalic and atraumatic.  Eyes: EOM are normal. Pupils are equal, round, and reactive to light.  Neck: Normal range of motion. Neck supple.  Cardiovascular: Normal rate and regular rhythm. Exam reveals no gallop  and no friction rub.  No murmur heard. Pulmonary/Chest: Effort normal and breath sounds normal. No respiratory distress. She has no wheezes.  Abdominal: Soft. Bowel sounds are normal. She exhibits no distension. There is no tenderness.  Musculoskeletal: Normal range of motion.  Neurological: She is alert and oriented to person, place, and time. No cranial nerve deficit. She exhibits normal muscle tone. Coordination normal.  Skin: Skin is warm and dry. She is not diaphoretic.  Nursing note and vitals reviewed.    ED Treatments / Results  Labs (all labs ordered are listed, but only abnormal results are displayed) Labs Reviewed  URINALYSIS, ROUTINE  W REFLEX MICROSCOPIC - Abnormal; Notable for the following components:      Result Value   Ketones, ur 20 (*)    All other components within normal limits  CBC WITH DIFFERENTIAL/PLATELET  COMPREHENSIVE METABOLIC PANEL  CBC WITH DIFFERENTIAL/PLATELET    EKG  EKG Interpretation None       Radiology No results found.  Procedures Procedures (including critical care time)  Medications Ordered in ED Medications  cloNIDine (CATAPRES) tablet 0.2 mg (not administered)     Initial Impression / Assessment and Plan / ED Course  I have reviewed the triage vital signs and the nursing notes.  Pertinent labs & imaging results that were available during my care of the patient were reviewed by me and considered in my medical decision making (see chart for details).  Patient presents with complaints of elevated blood pressure.  She was seen at the dentist and they refused to pull her teeth because of her hypertension.  This is been untreated for the past 5 years.  Her blood pressure was initially elevated.  She was given a dose of clonidine and most recent check is 134/86.  Her lab work shows no signs of endorgan damage.  At this point, I feel as though she is appropriate for discharge.  I will start Norvasc and have her follow-up with a primary doctor in the next week.  She is to keep a record of her blood pressures in the meantime.  Final Clinical Impressions(s) / ED Diagnoses   Final diagnoses:  None    ED Discharge Orders    None       Veryl Speak, MD 05/12/17 [phone]

## 2017-05-12 NOTE — Discharge Instructions (Signed)
Norvasc as prescribed.  Keep a record of your blood pressures over the next week and take this with you to your doctor's appointment.  Call to arrange a primary care provider.

## 2019-05-18 ENCOUNTER — Other Ambulatory Visit: Payer: Self-pay

## 2019-05-18 ENCOUNTER — Encounter (HOSPITAL_COMMUNITY): Payer: Self-pay

## 2019-05-18 ENCOUNTER — Ambulatory Visit (HOSPITAL_COMMUNITY)
Admission: EM | Admit: 2019-05-18 | Discharge: 2019-05-18 | Disposition: A | Discharge status: Home / Self Care | Payer: Medicare HMO | Attending: Family Medicine | Admitting: Family Medicine

## 2019-05-18 DIAGNOSIS — R21 Rash and other nonspecific skin eruption: Secondary | ICD-10-CM | POA: Diagnosis not present

## 2019-05-18 DIAGNOSIS — L245 Irritant contact dermatitis due to other chemical products: Secondary | ICD-10-CM

## 2019-05-18 DIAGNOSIS — L509 Urticaria, unspecified: Secondary | ICD-10-CM

## 2019-05-18 MED ORDER — PREDNISONE 10 MG (21) PO TBPK: ORAL_TABLET | Freq: Every day | ORAL | 0 refills | Status: AC

## 2019-05-18 NOTE — Discharge Instructions (Signed)
Call and follow up with dermatology with the information provided.   You have a contact dermatitis. Stay away from the products with unknown reactions.

## 2019-05-18 NOTE — ED Provider Notes (Signed)
[facility]    CSN: [id number] Arrival date & time: 05/18/19  1110      History   Chief Complaint Chief Complaint  Patient presents with  . Rash    HPI Crystal Phillips is a 69 y.o. female.   Patient reports rash to face and neck after trying to use over-the-counter hair growth remedy.  She reports that she has had this issue before with another hair growth remedy.  Reports that she is taking Benadryl with itching relief.  She is not made other attempts to treat this at home, requesting dermatology information.  Denies fever, headache, abdominal pain, nausea, vomiting, diarrhea, chills, shortness of breath, other symptoms.  ROS per HPI  The history is provided by the patient.    Past Medical History:  Diagnosis Date  . GERD (gastroesophageal reflux disease)   . Hypertension   . Hypokalemia     Patient Active Problem List   Diagnosis Date Noted  . Essential hypertension 03/29/2014  . Dyspnea 03/29/2014  . GERD (gastroesophageal reflux disease)     History reviewed. No pertinent surgical history.  OB History   No obstetric history on file.      Home Medications    Prior to Admission medications   Medication Sig Start Date End Date Taking? Authorizing Provider  amLODipine (NORVASC) 10 MG tablet Take 1 tablet (10 mg total) by mouth daily. 05/12/17   Veryl Speak, MD  cloNIDine (CATAPRES) 0.1 MG tablet Take 1 tablet (0.1 mg total) by mouth 2 (two) times daily. Patient not taking: Reported on 05/12/2017 03/29/14   Tanda Rockers, MD  predniSONE (STERAPRED UNI-PAK 21 TAB) 10 MG (21) TBPK tablet Take by mouth daily for 6 days. Take 6 tablets on day 1, 5 tablets on day 2, 4 tablets on day 3, 3 tablets on day 4, 2 tablets on day 5, 1 tablet on day 6 05/18/19 05/24/19  Faustino Congress, NP    Family History Family History  Problem Relation Age of Onset  . COPD Brother        smokes    Social History Social History   Tobacco Use  . Smoking status:  Current Some Day Smoker    Packs/day: 0.25    Years: 20.00    Pack years: 5.00    Types: Cigarettes  . Smokeless tobacco: Current User    Types: Snuff  Substance Use Topics  . Alcohol use: Yes    Alcohol/week: 0.0 standard drinks  . Drug use: No     Allergies   Aspirin   Review of Systems Review of Systems   Physical Exam Triage Vital Signs ED Triage Vitals  Enc Vitals Group     BP      Pulse      Resp      Temp      Temp src      SpO2      Weight      Height      Head Circumference      Peak Flow      Pain Score      Pain Loc      Pain Edu?      Excl. in [place]?    No data found.  Updated Vital Signs BP (!) 143/92 (BP Location: Left Arm)   Pulse 73   Temp 98.7 F (37.1 C) (Oral)   Resp 17   SpO2 98%   Visual Acuity Right Eye Distance:   Left Eye Distance:  Bilateral Distance:    Right Eye Near:   Left Eye Near:    Bilateral Near:     Physical Exam Vitals and nursing note reviewed.  Constitutional:      General: She is not in acute distress.    Appearance: Normal appearance. She is well-developed and normal weight.  HENT:     Head: Normocephalic and atraumatic.  Eyes:     Conjunctiva/sclera: Conjunctivae normal.  Cardiovascular:     Rate and Rhythm: Normal rate and regular rhythm.     Heart sounds: Normal heart sounds. No murmur.  Pulmonary:     Effort: Pulmonary effort is normal. No respiratory distress.     Breath sounds: Normal breath sounds. No stridor. No wheezing, rhonchi or rales.  Abdominal:     General: Bowel sounds are normal. There is no distension.     Palpations: Abdomen is soft. There is no mass.     Tenderness: There is no abdominal tenderness. There is no guarding or rebound.     Hernia: No hernia is present.  Musculoskeletal:        General: Normal range of motion.     Cervical back: Neck supple.  Skin:    General: Skin is warm and dry.     Capillary Refill: Capillary refill takes less than 2 seconds.     Findings:  Rash present.     Comments: Fine papular rash, not erythematous to face and skin on posterior and anterior neck.  Neurological:     General: No focal deficit present.     Mental Status: She is alert and oriented to person, place, and time.  Psychiatric:        Mood and Affect: Mood normal.        Behavior: Behavior normal.      UC Treatments / Results  Labs (all labs ordered are listed, but only abnormal results are displayed) Labs Reviewed - No data to display  EKG   Radiology No results found.  Procedures Procedures (including critical care time)  Medications Ordered in UC Medications - No data to display  Initial Impression / Assessment and Plan / UC Course  I have reviewed the triage vital signs and the nursing notes.  Pertinent labs & imaging results that were available during my care of the patient were reviewed by me and considered in my medical decision making (see chart for details).     Presents with fine papular rash to face and neck for 2 days.  Irritant dermatitis from over-the-counter hair growth remedy.  Instructed to follow-up with dermatology before trying another hair growth remedy.  Sent in Pred pack for patient, instructed on how to take.  Instructed to go to the ER for trouble swallowing, shortness of breath, high fever, or other concerning symptoms.  Information for dermatology printed with AVS.  Instructed to follow-up with our office as needed, but needs to go to dermatology. Final Clinical Impressions(s) / UC Diagnoses   Final diagnoses:  Rash  Urticaria  Irritant contact dermatitis due to other chemical products     Discharge Instructions     Call and follow up with dermatology with the information provided.   You have a contact dermatitis. Stay away from the products with unknown reactions.      ED Prescriptions    Medication Sig Dispense Auth. Provider   predniSONE (STERAPRED UNI-PAK 21 TAB) 10 MG (21) TBPK tablet Take by mouth  daily for 6 days. Take 6 tablets on day 1,  5 tablets on day 2, 4 tablets on day 3, 3 tablets on day 4, 2 tablets on day 5, 1 tablet on day 6 21 tablet Faustino Congress, NP     PDMP not reviewed this encounter.   Faustino Congress, NP 05/19/19 1124

## 2019-05-18 NOTE — ED Triage Notes (Signed)
Pt states she used a medication for scalp and got a rash on her head & neck.

## 2019-06-05 DIAGNOSIS — L853 Xerosis cutis: Secondary | ICD-10-CM | POA: Diagnosis not present

## 2019-06-05 DIAGNOSIS — L658 Other specified nonscarring hair loss: Secondary | ICD-10-CM | POA: Diagnosis not present

## 2019-06-05 DIAGNOSIS — L218 Other seborrheic dermatitis: Secondary | ICD-10-CM | POA: Diagnosis not present

## 2019-06-06 ENCOUNTER — Encounter (HOSPITAL_COMMUNITY): Payer: Self-pay | Admitting: Emergency Medicine

## 2019-06-06 ENCOUNTER — Other Ambulatory Visit: Payer: Self-pay

## 2019-06-06 ENCOUNTER — Ambulatory Visit (HOSPITAL_COMMUNITY)
Admission: EM | Admit: 2019-06-06 | Discharge: 2019-06-06 | Disposition: A | Discharge status: Home / Self Care | Payer: Medicare HMO | Attending: Family Medicine | Admitting: Family Medicine

## 2019-06-06 DIAGNOSIS — L249 Irritant contact dermatitis, unspecified cause: Secondary | ICD-10-CM | POA: Diagnosis not present

## 2019-06-06 MED ORDER — TRIAMCINOLONE 0.1 % CREAM:EUCERIN CREAM 1:1: 1.0000 "application " | TOPICAL_CREAM | Freq: Two times a day (BID) | CUTANEOUS | 0 refills | Status: AC

## 2019-06-06 MED ORDER — PREDNISONE 10 MG (21) PO TBPK: ORAL_TABLET | ORAL | 0 refills | Status: DC

## 2019-06-06 NOTE — Discharge Instructions (Addendum)
Take the medication as prescribed °Follow up as needed for continued or worsening symptoms ° °

## 2019-06-06 NOTE — ED Triage Notes (Signed)
Pt here for rash to neck with itching and pain with movement; pt sts new meds started yesterday and feels is worse today

## 2019-06-06 NOTE — ED Provider Notes (Signed)
[facility]    CSN: [id number] Arrival date & time: 06/06/19  1253    pt i  History   Chief Complaint Chief Complaint  Patient presents with  . Rash    HPI Crystal Phillips is a 69 y.o. female.   Pt is a 69 year old that presents with continued rash. This was treated here a few weeks ago and treated with prednisone.  This was after using a product on her skin that she had a reaction to.  The prednisone resolved this.  She then started to have more of the rash, itching and thickened skin to the neck area and facial area.  Denies using that product again.  She does use Rogaine on her hair.  She is not having any scalp issues.  She was seen by dermatology and given ketoconazole cream and hydrocortisone cream which she used and reports worsen the symptoms.  The rash is very itchy.  Denies any new products.   Denies any fever, joint pain. Denies any recent changes in lotions, detergents, foods or other possible irritants. No recent travel. Nobody else at home has the rash. Patient has been outside but denies any contact with plants or insects. No new foods or medications.   ROS per HPI      Past Medical History:  Diagnosis Date  . GERD (gastroesophageal reflux disease)   . Hypertension   . Hypokalemia     Patient Active Problem List   Diagnosis Date Noted  . Essential hypertension 03/29/2014  . Dyspnea 03/29/2014  . GERD (gastroesophageal reflux disease)     History reviewed. No pertinent surgical history.  OB History   No obstetric history on file.      Home Medications    Prior to Admission medications   Medication Sig Start Date End Date Taking? Authorizing Provider  amLODipine (NORVASC) 10 MG tablet Take 1 tablet (10 mg total) by mouth daily. 05/12/17   Veryl Speak, MD  cloNIDine (CATAPRES) 0.1 MG tablet Take 1 tablet (0.1 mg total) by mouth 2 (two) times daily. Patient not taking: Reported on 05/12/2017 03/29/14   Tanda Rockers, MD  predniSONE  (STERAPRED UNI-PAK 21 TAB) 10 MG (21) TBPK tablet 6 tabs for 1 day, then 5 tabs for 1 das, then 4 tabs for 1 day, then 3 tabs for 1 day, 2 tabs for 1 day, then 1 tab for 1 day 06/06/19   Loura Halt A, NP  Triamcinolone Acetonide (TRIAMCINOLONE 0.1 % CREAM : EUCERIN) CREA Apply 1 application topically 2 (two) times daily. 06/06/19   Orvan July, NP    Family History Family History  Problem Relation Age of Onset  . COPD Brother        smokes    Social History Social History   Tobacco Use  . Smoking status: Current Some Day Smoker    Packs/day: 0.25    Years: 20.00    Pack years: 5.00    Types: Cigarettes  . Smokeless tobacco: Current User    Types: Snuff  Substance Use Topics  . Alcohol use: Yes    Alcohol/week: 0.0 standard drinks  . Drug use: No     Allergies   Aspirin   Review of Systems Review of Systems   Physical Exam Triage Vital Signs ED Triage Vitals  Enc Vitals Group     BP 06/06/19 1352 (!) 167/94     Pulse Rate 06/06/19 1352 68     Resp 06/06/19 1352 18  Temp 06/06/19 1352 98.4 F (36.9 C)     Temp Source 06/06/19 1352 Oral     SpO2 06/06/19 1352 97 %     Weight --      Height --      Head Circumference --      Peak Flow --      Pain Score 06/06/19 1353 3     Pain Loc --      Pain Edu? --      Excl. in [place]? --    No data found.  Updated Vital Signs BP (!) 167/94 (BP Location: Right Arm)   Pulse 68   Temp 98.4 F (36.9 C) (Oral)   Resp 18   SpO2 97%   Visual Acuity Right Eye Distance:   Left Eye Distance:   Bilateral Distance:    Right Eye Near:   Left Eye Near:    Bilateral Near:     Physical Exam Vitals and nursing note reviewed.  Constitutional:      General: She is not in acute distress.    Appearance: Normal appearance. She is not ill-appearing, toxic-appearing or diaphoretic.  HENT:     Head: Normocephalic.     Nose: Nose normal.  Eyes:     Conjunctiva/sclera: Conjunctivae normal.  Pulmonary:     Effort:  Pulmonary effort is normal.  Musculoskeletal:        General: Normal range of motion.     Cervical back: Normal range of motion.  Skin:    General: Skin is warm and dry.     Findings: Rash present.     Comments: Thickened hyperpigmented skin to neck area and multiple scaly patches to face.  Neurological:     Mental Status: She is alert.  Psychiatric:        Mood and Affect: Mood normal.      UC Treatments / Results  Labs (all labs ordered are listed, but only abnormal results are displayed) Labs Reviewed - No data to display  EKG   Radiology No results found.  Procedures Procedures (including critical care time)  Medications Ordered in UC Medications - No data to display  Initial Impression / Assessment and Plan / UC Course  I have reviewed the triage vital signs and the nursing notes.  Pertinent labs & imaging results that were available during my care of the patient were reviewed by me and considered in my medical decision making (see chart for details).     Rash consistent irritant contact dermatitis or eczema. Treating with prednisone taper over the next 6 days.  Prescribed triamcinolone and Eucerin cream to apply on the rash. Recommend not using any products on the face or hair Follow up as needed for continued or worsening symptoms  Final Clinical Impressions(s) / UC Diagnoses   Final diagnoses:  Irritant contact dermatitis, unspecified trigger     Discharge Instructions     Take the medication as prescribed Follow up as needed for continued or worsening symptoms     ED Prescriptions    Medication Sig Dispense Auth. Provider   predniSONE (STERAPRED UNI-PAK 21 TAB) 10 MG (21) TBPK tablet 6 tabs for 1 day, then 5 tabs for 1 das, then 4 tabs for 1 day, then 3 tabs for 1 day, 2 tabs for 1 day, then 1 tab for 1 day 21 tablet Prestyn Mahn A, NP   Triamcinolone Acetonide (TRIAMCINOLONE 0.1 % CREAM : EUCERIN) CREA Apply 1 application topically 2 (two) times  daily. 1  each Orvan July, NP     PDMP not reviewed this encounter.   Loura Halt A, NP 06/06/19 1506

## 2020-07-22 ENCOUNTER — Encounter (HOSPITAL_COMMUNITY): Payer: Self-pay | Admitting: *Deleted

## 2020-07-22 ENCOUNTER — Emergency Department (HOSPITAL_COMMUNITY)
Admission: EM | Admit: 2020-07-22 | Discharge: 2020-07-22 | Disposition: A | Discharge status: Home / Self Care | Payer: Medicare HMO | Attending: Emergency Medicine | Admitting: Emergency Medicine

## 2020-07-22 DIAGNOSIS — Z79899 Other long term (current) drug therapy: Secondary | ICD-10-CM | POA: Diagnosis not present

## 2020-07-22 DIAGNOSIS — R630 Anorexia: Secondary | ICD-10-CM

## 2020-07-22 DIAGNOSIS — I10 Essential (primary) hypertension: Secondary | ICD-10-CM | POA: Diagnosis not present

## 2020-07-22 DIAGNOSIS — F17210 Nicotine dependence, cigarettes, uncomplicated: Secondary | ICD-10-CM | POA: Insufficient documentation

## 2020-07-22 DIAGNOSIS — E875 Hyperkalemia: Secondary | ICD-10-CM | POA: Insufficient documentation

## 2020-07-22 DIAGNOSIS — R531 Weakness: Secondary | ICD-10-CM | POA: Diagnosis present

## 2020-07-22 LAB — COMPREHENSIVE METABOLIC PANEL
ALT: 16 U/L (ref 0–44)
AST: 24 U/L (ref 15–41)
Albumin: 4.2 g/dL (ref 3.5–5.0)
Alkaline Phosphatase: 44 U/L (ref 38–126)
Anion gap: 5 (ref 5–15)
BUN: 18 mg/dL (ref 8–23)
CO2: 28 mmol/L (ref 22–32)
Calcium: 9.6 mg/dL (ref 8.9–10.3)
Chloride: 107 mmol/L (ref 98–111)
Creatinine, Ser: 0.76 mg/dL (ref 0.44–1.00)
GFR, Estimated: >60 mL/min (ref >60)
Glucose, Bld: 97 mg/dL (ref 70–99)
Potassium: 5.2 mmol/L — ABNORMAL HIGH (ref 3.5–5.1)
Sodium: 140 mmol/L (ref 135–145)
Total Bilirubin: 0.3 mg/dL (ref 0.3–1.2)
Total Protein: 7.9 g/dL (ref 6.5–8.1)

## 2020-07-22 LAB — CBC WITH DIFFERENTIAL/PLATELET
Abs Immature Granulocytes: 0.01 10*3/uL (ref 0.00–0.07)
Basophils Absolute: 0 10*3/uL (ref 0.0–0.1)
Basophils Relative: 1 %
Eosinophils Absolute: 0.1 10*3/uL (ref 0.0–0.5)
Eosinophils Relative: 1 %
HCT: 38.8 % (ref 36.0–46.0)
Hemoglobin: 12.5 g/dL (ref 12.0–15.0)
Immature Granulocytes: 0 %
Lymphocytes Relative: 39 %
Lymphs Abs: 2.2 10*3/uL (ref 0.7–4.0)
MCH: 29.3 pg (ref 26.0–34.0)
MCHC: 32.2 g/dL (ref 30.0–36.0)
MCV: 91.1 fL (ref 80.0–100.0)
Monocytes Absolute: 0.6 10*3/uL (ref 0.1–1.0)
Monocytes Relative: 11 %
Neutro Abs: 2.7 10*3/uL (ref 1.7–7.7)
Neutrophils Relative %: 48 %
Platelets: 249 10*3/uL (ref 150–400)
RBC: 4.26 MIL/uL (ref 3.87–5.11)
RDW: 13.5 % (ref 11.5–15.5)
WBC: 5.6 10*3/uL (ref 4.0–10.5)
nRBC: 0 % (ref 0.0–0.2)

## 2020-07-22 MED ORDER — LOKELMA 5 G PO PACK: 5.0000 g | PACK | Freq: Every day | ORAL | 0 refills | Status: AC

## 2020-07-22 MED ORDER — MIRTAZAPINE 7.5 MG PO TABS: 15.0000 mg | ORAL_TABLET | Freq: Every day | ORAL | 0 refills | Status: AC

## 2020-07-22 MED ORDER — SODIUM ZIRCONIUM CYCLOSILICATE 10 G PO PACK
10.0000 g | PACK | Freq: Every day | ORAL | Status: DC
  Administered 2020-07-22: 10 g via ORAL
  Filled 2020-07-22: qty 1

## 2020-07-22 NOTE — ED Provider Notes (Signed)
[facility] DEPT Provider Note   CSN: [id number] Arrival date & time: 07/22/20  5537     History Chief Complaint  Patient presents with  . wants something to help her eat    Crystal Phillips is a 70 y.o. female.  HPI     70yo female with history of hypertension presents with not eating.   Dips every day and night since she was 70 years old Will feel hungry but then just "don't have the mind to eat" "Some people eat when they are stressed, I starve myself"  Sometimes will use dip instead of eating Always been 98lb, last week was 110lb but not eating for last few days Poor appetite, doesn't have thing for food, "me and food have never been friends"  For 3-4 days have felt weak from not eating, no energy fatigue Doesn't go to doctor often but came because felt like sympoms getting worse and not eating and feeling badly last few days No nausea or vomiting, no recent diarrhea/constipation  No abdominal pain, chest pain, shortness of breath, urinary symptoms, no difficulty swallowing Taking in liquids and gatorade/water, last 2-3 days taking ensure Not feeling depressed  Not taking blood pressure medications, not taking medications, not a pill or doctor person  Denies depression/SI, reports feels happy and energy level, "I feel fine but I can't be because I don't eat",  Had a few bites of cook out bbq sandwhich last night but probably won't for a few days    Past Medical History:  Diagnosis Date  . GERD (gastroesophageal reflux disease)   . Hypertension   . Hypokalemia     Patient Active Problem List   Diagnosis Date Noted  . Essential hypertension 03/29/2014  . Dyspnea 03/29/2014  . GERD (gastroesophageal reflux disease)     History reviewed. No pertinent surgical history.   OB History   No obstetric history on file.     Family History  Problem Relation Age of Onset  . COPD Brother        smokes    Social History   Tobacco  Use  . Smoking status: Current Some Day Smoker    Packs/day: 0.25    Years: 20.00    Pack years: 5.00    Types: Cigarettes  . Smokeless tobacco: Current User    Types: Snuff  Substance Use Topics  . Alcohol use: Yes    Alcohol/week: 0.0 standard drinks  . Drug use: No    Home Medications Prior to Admission medications   Medication Sig Start Date End Date Taking? Authorizing Provider  mirtazapine (REMERON) 7.5 MG tablet Take 2 tablets (15 mg total) by mouth at bedtime. 07/22/20 08/21/20 Yes Gareth Morgan, MD  sodium zirconium cyclosilicate (LOKELMA) 5 g packet Take 5 g by mouth daily for 2 days. 07/22/20 07/24/20 Yes Gareth Morgan, MD  amLODipine (NORVASC) 10 MG tablet Take 1 tablet (10 mg total) by mouth daily. 05/12/17   Veryl Speak, MD  cloNIDine (CATAPRES) 0.1 MG tablet Take 1 tablet (0.1 mg total) by mouth 2 (two) times daily. Patient not taking: Reported on 05/12/2017 03/29/14   Tanda Rockers, MD  Triamcinolone Acetonide (TRIAMCINOLONE 0.1 % CREAM : EUCERIN) CREA Apply 1 application topically 2 (two) times daily. 06/06/19   Orvan July, NP    Allergies    Aspirin  Review of Systems   Review of Systems  Constitutional: Positive for appetite change and fatigue. Negative for fever.  HENT: Negative for sore throat.  Eyes: Negative for visual disturbance.  Respiratory: Negative for cough and shortness of breath.   Cardiovascular: Negative for chest pain.  Gastrointestinal: Negative for abdominal pain, nausea and vomiting.  Genitourinary: Negative for difficulty urinating.  Musculoskeletal: Negative for back pain and neck pain.  Skin: Negative for rash.  Neurological: Negative for syncope and headaches.    Physical Exam Updated Vital Signs BP (!) 150/98   Pulse 71   Temp 97.9 F (36.6 C) (Oral)   Resp 16   SpO2 99%   Physical Exam Vitals and nursing note reviewed.  Constitutional:      General: She is not in acute distress.    Appearance: She is  well-developed. She is not diaphoretic.  HENT:     Head: Normocephalic and atraumatic.     Mouth/Throat:     Pharynx: No oropharyngeal exudate or posterior oropharyngeal erythema.  Eyes:     Conjunctiva/sclera: Conjunctivae normal.  Cardiovascular:     Rate and Rhythm: Normal rate and regular rhythm.     Heart sounds: Normal heart sounds. No murmur heard. No friction rub. No gallop.   Pulmonary:     Effort: Pulmonary effort is normal. No respiratory distress.     Breath sounds: Normal breath sounds. No wheezing or rales.  Abdominal:     General: There is no distension.     Palpations: Abdomen is soft.     Tenderness: There is no abdominal tenderness. There is no guarding.  Musculoskeletal:        General: No tenderness.     Cervical back: Normal range of motion.  Skin:    General: Skin is warm and dry.     Findings: No erythema or rash.  Neurological:     Mental Status: She is alert and oriented to person, place, and time.     ED Results / Procedures / Treatments   Labs (all labs ordered are listed, but only abnormal results are displayed) Labs Reviewed  COMPREHENSIVE METABOLIC PANEL - Abnormal; Notable for the following components:      Result Value   Potassium 5.2 (*)    All other components within normal limits  CBC WITH DIFFERENTIAL/PLATELET    EKG EKG Interpretation  Date/Time:  Monday Jul 22 2020 11:28:20 EDT Ventricular Rate:  68 PR Interval:  163 QRS Duration: 86 QT Interval:  421 QTC Calculation: 448 R Axis:   59 Text Interpretation: Sinus rhythm No significant change since last tracing Confirmed by Gareth Morgan [phone]) on 07/22/2020 12:01:06 PM   Radiology No results found.  Procedures Procedures   Medications Ordered in ED Medications - No data to display  ED Course  I have reviewed the triage vital signs and the nursing notes.  Pertinent labs & imaging results that were available during my care of the patient were reviewed by me and  considered in my medical decision making (see chart for details).    MDM Rules/Calculators/A&P                          70yo female with history of hypertension presents with concern for inability to eat.  She reports she has had difficulty eating her whole life, however felt worried that it was getting worse and thinking about the consequences and came to the ED.  Vital signs stable. No oral or pharyngeal abnormalities, reports she is able to swallow but just doesn't feel like eating.  GIven history, ordered CBC and CMP to evaluate for  abnormalities.  No anemia or renal failure.  CMP shows mild hyperkalemia, likely in the setting of supplementation with ensure.  Recommend decreasing high potassium containing supplementation and gave lokelma to take for days.  Recommend PCP follow up, possible GI evaluation. Given mirtazapine rx to see if it does increase appetite for her. Patient discharged in stable condition with understanding of reasons to return.     Final Clinical Impression(s) / ED Diagnoses Final diagnoses:  Decreased appetite  Hyperkalemia    Rx / DC Orders ED Discharge Orders         Ordered    mirtazapine (REMERON) 7.5 MG tablet  Daily at bedtime        07/22/20 1329    sodium zirconium cyclosilicate (LOKELMA) 5 g packet  Daily        07/22/20 1329           Gareth Morgan, MD 07/22/20 2053

## 2020-07-22 NOTE — ED Notes (Signed)
No distress noted when checked frequently.  Patient is on her telephone.

## 2020-07-22 NOTE — ED Notes (Signed)
Patient is alert- reports having trouble eating with her dentures.  States "I don't know how to eat with my teeth".  She reports that she has decreased appetite

## 2020-07-22 NOTE — ED Triage Notes (Signed)
Pt states she would like something to make her eat. She reports she has always had difficulty eating but has never been seen for this. No recent illness.

## 2020-07-23 NOTE — Progress Notes (Signed)
07/23/2020 1206 pm Referral to arrange follow up with PCP. Attempted call to pt and left voice message for return call. [place], [place] ED TOC CM [phone]

## 2021-08-07 ENCOUNTER — Other Ambulatory Visit: Payer: Self-pay | Admitting: Family Medicine

## 2021-08-07 DIAGNOSIS — Z79899 Other long term (current) drug therapy: Secondary | ICD-10-CM | POA: Diagnosis not present

## 2021-08-07 DIAGNOSIS — Z1231 Encounter for screening mammogram for malignant neoplasm of breast: Secondary | ICD-10-CM

## 2021-08-07 DIAGNOSIS — I10 Essential (primary) hypertension: Secondary | ICD-10-CM | POA: Diagnosis not present

## 2021-08-18 ENCOUNTER — Ambulatory Visit: Payer: Medicare HMO

## 2021-08-21 ENCOUNTER — Ambulatory Visit
Admission: RE | Admit: 2021-08-21 | Discharge: 2021-08-21 | Disposition: A | Discharge status: Home / Self Care | Payer: Medicare Other | Source: Ambulatory Visit | Attending: Family Medicine | Admitting: Family Medicine

## 2021-08-21 DIAGNOSIS — Z1231 Encounter for screening mammogram for malignant neoplasm of breast: Secondary | ICD-10-CM | POA: Diagnosis not present

## 2021-11-11 ENCOUNTER — Encounter (HOSPITAL_COMMUNITY): Payer: Self-pay

## 2021-11-11 ENCOUNTER — Emergency Department (HOSPITAL_COMMUNITY)
Admission: EM | Admit: 2021-11-11 | Discharge: 2021-11-11 | Disposition: A | Discharge status: Home / Self Care | Payer: Medicare Other | Attending: Student | Admitting: Student

## 2021-11-11 ENCOUNTER — Other Ambulatory Visit: Payer: Self-pay

## 2021-11-11 DIAGNOSIS — Z9189 Other specified personal risk factors, not elsewhere classified: Secondary | ICD-10-CM

## 2021-11-11 DIAGNOSIS — F17210 Nicotine dependence, cigarettes, uncomplicated: Secondary | ICD-10-CM | POA: Insufficient documentation

## 2021-11-11 DIAGNOSIS — E860 Dehydration: Secondary | ICD-10-CM | POA: Diagnosis not present

## 2021-11-11 DIAGNOSIS — I10 Essential (primary) hypertension: Secondary | ICD-10-CM | POA: Insufficient documentation

## 2021-11-11 DIAGNOSIS — E638 Other specified nutritional deficiencies: Secondary | ICD-10-CM | POA: Diagnosis not present

## 2021-11-11 DIAGNOSIS — K219 Gastro-esophageal reflux disease without esophagitis: Secondary | ICD-10-CM | POA: Diagnosis not present

## 2021-11-11 DIAGNOSIS — R531 Weakness: Secondary | ICD-10-CM | POA: Insufficient documentation

## 2021-11-11 DIAGNOSIS — E876 Hypokalemia: Secondary | ICD-10-CM | POA: Diagnosis not present

## 2021-11-11 DIAGNOSIS — Z79899 Other long term (current) drug therapy: Secondary | ICD-10-CM | POA: Insufficient documentation

## 2021-11-11 DIAGNOSIS — R630 Anorexia: Secondary | ICD-10-CM | POA: Insufficient documentation

## 2021-11-11 LAB — BASIC METABOLIC PANEL
Anion gap: 11 (ref 5–15)
BUN: 19 mg/dL (ref 8–23)
CO2: 24 mmol/L (ref 22–32)
Calcium: 10 mg/dL (ref 8.9–10.3)
Chloride: 101 mmol/L (ref 98–111)
Creatinine, Ser: 1 mg/dL (ref 0.44–1.00)
GFR, Estimated: >60 mL/min (ref >60)
Glucose, Bld: 121 mg/dL — ABNORMAL HIGH (ref 70–99)
Potassium: 3.4 mmol/L — ABNORMAL LOW (ref 3.5–5.1)
Sodium: 136 mmol/L (ref 135–145)

## 2021-11-11 LAB — URINALYSIS, ROUTINE W REFLEX MICROSCOPIC
Bacteria, UA: NONE SEEN
Bilirubin Urine: NEGATIVE
Glucose, UA: NEGATIVE mg/dL
Hgb urine dipstick: NEGATIVE
Ketones, ur: NEGATIVE mg/dL
Leukocytes,Ua: NEGATIVE
Nitrite: NEGATIVE
Protein, ur: NEGATIVE mg/dL
Specific Gravity, Urine: 1.008 (ref 1.005–1.030)
pH: 5 (ref 5.0–8.0)

## 2021-11-11 LAB — CBC
HCT: 35.3 % — ABNORMAL LOW (ref 36.0–46.0)
Hemoglobin: 11.8 g/dL — ABNORMAL LOW (ref 12.0–15.0)
MCH: 29.8 pg (ref 26.0–34.0)
MCHC: 33.4 g/dL (ref 30.0–36.0)
MCV: 89.1 fL (ref 80.0–100.0)
Platelets: 256 10*3/uL (ref 150–400)
RBC: 3.96 MIL/uL (ref 3.87–5.11)
RDW: 13.3 % (ref 11.5–15.5)
WBC: 6.2 10*3/uL (ref 4.0–10.5)
nRBC: 0 % (ref 0.0–0.2)

## 2021-11-11 LAB — CBG MONITORING, ED: Glucose-Capillary: 141 mg/dL — ABNORMAL HIGH (ref 70–99)

## 2021-11-11 NOTE — ED Notes (Signed)
Patient ate applesauce and drank water, was given a sandwich to take home

## 2021-11-11 NOTE — ED Provider Triage Note (Signed)
Emergency Medicine Provider Triage Evaluation Note  Crystal Phillips , a 71 y.o. female  was evaluated in triage.  Pt complains of diarrhea, weakness, and feeling unwell over the last 3 to 4 days.  Having difficulty staying hydrated.  Also with decreased appetite, is concerned about this.  Feels she should still be hungry though is not.  Last note felt "as if I might die, I thought I was going to leave this earth".  Unable to elaborate further.  Denies fevers, constipation, abdominal pain, chest pain, or shortness of breath  Review of Systems  Positive:  Negative: See above  Physical Exam  BP (!) 148/96 (BP Location: Left Arm)   Pulse 78   Temp 98.3 F (36.8 C) (Oral)   Resp 12   SpO2 100%  Gen:   Awake, no distress   Resp:  Normal effort  MSK:   Moves extremities without difficulty  Other:  Appears clinically dehydrated, abdomen soft nontender.  RRR without M/R/G  Medical Decision Making  Medically screening exam initiated at 2:39 PM.  Appropriate orders placed.  Elie Mccarthy was informed that the remainder of the evaluation will be completed by another provider, this initial triage assessment does not replace that evaluation, and the importance of remaining in the ED until their evaluation is complete.     Prince Rome, PA-C 82/99/37 1441

## 2021-11-11 NOTE — ED Triage Notes (Signed)
Pt reports generalized weakness, loss of appetite, and diarrhea over the past few days. Pt reports feeling like she was going to pass out earlier today.

## 2021-11-11 NOTE — Discharge Instructions (Signed)
Continue with your PCP follow-up tomorrow as planned.  Be sure to discuss everything that we talked about today.  Your homework today is to go home and make sure you increase your food intake, make sure you are still drinking plenty of water.  Further information on soft food preparation and eating plants has been provided for you to for you to review, in addition to general nutrition as well.  Return to the ED for any new or worsening symptoms as discussed.

## 2021-11-11 NOTE — ED Provider Notes (Signed)
[facility] DEPT Provider Note   CSN: [id number] Arrival date & time: 11/11/21  1415     History  Chief Complaint  Patient presents with   Weakness    Crystal Phillips is a 71 y.o. female with Hx of essential HTN, GERD, and chronic decreased appetite/nutritional deficiency.  Presenting today with weakness.  States she has been unable to eat much food since Friday, only occasional bites here and there.  Reports chronic decreased appetite since she was "63 to 71 years old" due to growing up poor.  States he did not have enough money to eat well, could usually only eat beans from a can each day.  Also has been "dipping" chewing tobacco since she was 6, which also decreases her appetite.  Every now and then goes through periods where she decreases from eating small amounts, to going 2 to 3 days without eating.  Then usually feels weak after doing this.  Reports being repulsed by eating and said "if a gun was to my head and I had to eat food, I cannot do it, I could not tell you why".  Sometimes eats Jell-O, though usually able to drink "sips" of Ensure on occasion.  Denies fever, shortness of breath, dizziness, lightheadedness, chest pain, fevers, or any other complaints at this time.  Has an appointment with her PCP tomorrow to discuss this.  The history is provided by the patient and medical records.  Weakness    Home Medications Prior to Admission medications   Medication Sig Start Date End Date Taking? Authorizing Provider  amLODipine (NORVASC) 10 MG tablet Take 1 tablet (10 mg total) by mouth daily. 05/12/17   Veryl Speak, MD  cloNIDine (CATAPRES) 0.1 MG tablet Take 1 tablet (0.1 mg total) by mouth 2 (two) times daily. Patient not taking: Reported on 05/12/2017 03/29/14   Tanda Rockers, MD  mirtazapine (REMERON) 7.5 MG tablet Take 2 tablets (15 mg total) by mouth at bedtime. 07/22/20 08/21/20  Gareth Morgan, MD  Triamcinolone Acetonide (TRIAMCINOLONE 0.1 %  CREAM : EUCERIN) CREA Apply 1 application topically 2 (two) times daily. 06/06/19   Orvan July, NP      Allergies    Aspirin    Review of Systems   Review of Systems  Neurological:  Positive for weakness.    Physical Exam Updated Vital Signs BP (!) 163/99 (BP Location: Left Arm)   Pulse 66   Temp 98.2 F (36.8 C) (Oral)   Resp 20   SpO2 100%  Physical Exam Vitals and nursing note reviewed.  Constitutional:      General: She is not in acute distress.    Appearance: She is well-developed and underweight. She is not ill-appearing, toxic-appearing or diaphoretic.  HENT:     Head: Normocephalic and atraumatic.  Eyes:     Conjunctiva/sclera: Conjunctivae normal.  Cardiovascular:     Rate and Rhythm: Normal rate and regular rhythm.     Pulses:          Radial pulses are 2+ on the right side and 2+ on the left side.     Heart sounds: No murmur heard. Pulmonary:     Effort: Pulmonary effort is normal. No tachypnea or respiratory distress.     Breath sounds: Normal breath sounds.  Chest:     Chest wall: No tenderness.  Abdominal:     General: Abdomen is flat. There is no distension.     Palpations: Abdomen is soft. There is no mass  or pulsatile mass.     Tenderness: There is no abdominal tenderness.  Musculoskeletal:        General: No swelling.     Cervical back: Neck supple.     Right lower leg: No edema.     Left lower leg: No edema.  Skin:    General: Skin is warm and dry.     Capillary Refill: Capillary refill takes less than 2 seconds.  Neurological:     Mental Status: She is alert.  Psychiatric:        Mood and Affect: Mood is anxious.        Behavior: Behavior is cooperative.     Comments: Appears to grimace and seem repulsed at the discussion of food or the thought of swallowing food.    ED Results / Procedures / Treatments   Labs (all labs ordered are listed, but only abnormal results are displayed) Labs Reviewed  BASIC METABOLIC PANEL - Abnormal;  Notable for the following components:      Result Value   Potassium 3.4 (*)    Glucose, Bld 121 (*)    All other components within normal limits  CBC - Abnormal; Notable for the following components:   Hemoglobin 11.8 (*)    HCT 35.3 (*)    All other components within normal limits  CBG MONITORING, ED - Abnormal; Notable for the following components:   Glucose-Capillary 141 (*)    All other components within normal limits  URINALYSIS, ROUTINE W REFLEX MICROSCOPIC    EKG None  Radiology No results found.  Procedures Procedures    Medications Ordered in ED Medications - No data to display  ED Course/ Medical Decision Making/ A&P                           Medical Decision Making Amount and/or Complexity of Data Reviewed Labs: ordered.   71 y.o. female presents to the ED for concern of Weakness   This involves an extensive number of treatment options, and is a complaint that carries with it a high risk of complications and morbidity.    Past Medical History / Co-morbidities / Social History: Hx of HTN, GERD, tobacco use (chewing) Social Determinants of Health include: Elderly, chronic poor nutritional intake  Additional History:  None  Lab Tests: I ordered, and personally interpreted labs.  The pertinent results include:   UA unremarkable CBG 141 CBC and CMP overall unremarkable, mild decreased H&H from 1 year ago now 11.8/35.3  Imaging Studies: None  ED Course: Pt well-appearing on exam.  Nonseptic, nontoxic appearing in NAD.  AAOx4.  Sitting comfortably.  Presenting with increased weakness over the last 3 to 4 days.  Admits to chronic poor nutritional intake.  Describes this is one of her episodes that happens every couple weeks.  Describes history of poor access to foods growing up due to financial hardships.  States these behaviors continued into adulthood.  Every couple weeks, goes through several days of even more severe restriction of nutritional intake.   Per patient this is a chronic intermittent issue. Appears underweight, and somewhat malnourished.  Overall exam findings unremarkable.  General coordination, ROM, and strength appear grossly intact.  Does not appear significantly weak on exam.  This appears to be a chronic issue.  Without abdominal pain.  No N/V, urinary symptoms, or diarrhea.  No neurological deficits appreciated.  Labs overall unremarkable.  No evidence of acute electrolyte imbalance.  Low suspicion  of pulmonary or cardiac acute contribution.  Clinical presentation not suggestive of GERD. Shared decision making with patient and her family.  They have a follow-up appointment with the patient's PCP tomorrow.  They plan to discuss this further with them there.  They just wanted to make sure her labs looked okay.  I feel this is reasonable, as patient does not appear to be experiencing an emergent medical condition at this time.  Recommend increasing nutritional intake, fluid intake, and discussing this with PCP as planned.  Applesauce, crackers, juice provided.  Patient satisfied with today's encounter.  Patient in NAD and in good condition at time of discharge.  Disposition: After consideration the patient's encounter today, I do not feel today's workup suggests an emergent condition requiring admission or immediate intervention beyond what has been performed at this time.  Safe for discharge; instructed to return immediately for worsening symptoms, change in symptoms or any other concerns.  I have reviewed the patients home medicines and have made adjustments as needed.  Discussed course of treatment with the patient, whom demonstrated understanding.  Patient in agreement and has no further questions.    I discussed this case with my attending physician Dr. Matilde Sprang, who agreed with the proposed treatment course and cosigned this note including patient's presenting symptoms, physical exam, and planned diagnostics and interventions.  Attending  physician stated agreement with plan or made changes to plan which were implemented.     This chart was dictated using voice recognition software.  Despite best efforts to proofread, errors can occur which can change the documentation meaning.         Final Clinical Impression(s) / ED Diagnoses Final diagnoses:  Weakness  At risk for deficient intake of food    Rx / DC Orders ED Discharge Orders     None         Candace Cruise 71/21/97 2305    Teressa Lower, MD 11/12/21 2026

## 2021-11-28 DIAGNOSIS — E440 Moderate protein-calorie malnutrition: Secondary | ICD-10-CM | POA: Diagnosis not present

## 2021-11-28 DIAGNOSIS — R634 Abnormal weight loss: Secondary | ICD-10-CM | POA: Diagnosis not present

## 2021-12-03 ENCOUNTER — Other Ambulatory Visit: Payer: Self-pay | Admitting: Family Medicine

## 2021-12-03 DIAGNOSIS — E46 Unspecified protein-calorie malnutrition: Secondary | ICD-10-CM

## 2021-12-03 DIAGNOSIS — R634 Abnormal weight loss: Secondary | ICD-10-CM

## 2021-12-03 DIAGNOSIS — R627 Adult failure to thrive: Secondary | ICD-10-CM

## 2021-12-24 DIAGNOSIS — I10 Essential (primary) hypertension: Secondary | ICD-10-CM | POA: Diagnosis not present

## 2021-12-24 DIAGNOSIS — R053 Chronic cough: Secondary | ICD-10-CM | POA: Diagnosis not present

## 2021-12-31 ENCOUNTER — Ambulatory Visit
Admission: RE | Admit: 2021-12-31 | Discharge: 2021-12-31 | Disposition: A | Discharge status: Home / Self Care | Payer: Medicare Other | Source: Ambulatory Visit | Attending: Family Medicine | Admitting: Family Medicine

## 2021-12-31 DIAGNOSIS — R627 Adult failure to thrive: Secondary | ICD-10-CM

## 2021-12-31 DIAGNOSIS — R634 Abnormal weight loss: Secondary | ICD-10-CM

## 2021-12-31 DIAGNOSIS — E46 Unspecified protein-calorie malnutrition: Secondary | ICD-10-CM

## 2021-12-31 DIAGNOSIS — K5710 Diverticulosis of small intestine without perforation or abscess without bleeding: Secondary | ICD-10-CM | POA: Diagnosis not present

## 2021-12-31 DIAGNOSIS — R911 Solitary pulmonary nodule: Secondary | ICD-10-CM | POA: Diagnosis not present

## 2021-12-31 DIAGNOSIS — J439 Emphysema, unspecified: Secondary | ICD-10-CM | POA: Diagnosis not present

## 2021-12-31 MED ORDER — IOPAMIDOL (ISOVUE-300) INJECTION 61%
100.0000 mL | Freq: Once | INTRAVENOUS | Status: AC | PRN
  Administered 2021-12-31: 100 mL via INTRAVENOUS

## 2022-01-09 DIAGNOSIS — Z1211 Encounter for screening for malignant neoplasm of colon: Secondary | ICD-10-CM | POA: Diagnosis not present

## 2022-01-09 DIAGNOSIS — K2930 Chronic superficial gastritis without bleeding: Secondary | ICD-10-CM | POA: Diagnosis not present

## 2022-01-09 DIAGNOSIS — K31A15 Gastric intestinal metaplasia without dysplasia, involving multiple sites: Secondary | ICD-10-CM | POA: Diagnosis not present

## 2022-01-09 DIAGNOSIS — K222 Esophageal obstruction: Secondary | ICD-10-CM | POA: Diagnosis not present

## 2022-01-09 DIAGNOSIS — K31A19 Gastric intestinal metaplasia without dysplasia, unspecified site: Secondary | ICD-10-CM | POA: Diagnosis not present

## 2022-01-09 DIAGNOSIS — K2940 Chronic atrophic gastritis without bleeding: Secondary | ICD-10-CM | POA: Diagnosis not present

## 2022-01-09 DIAGNOSIS — K2980 Duodenitis without bleeding: Secondary | ICD-10-CM | POA: Diagnosis not present

## 2022-01-09 DIAGNOSIS — D123 Benign neoplasm of transverse colon: Secondary | ICD-10-CM | POA: Diagnosis not present

## 2022-01-09 DIAGNOSIS — D120 Benign neoplasm of cecum: Secondary | ICD-10-CM | POA: Diagnosis not present

## 2022-01-09 DIAGNOSIS — K449 Diaphragmatic hernia without obstruction or gangrene: Secondary | ICD-10-CM | POA: Diagnosis not present

## 2022-01-09 DIAGNOSIS — K648 Other hemorrhoids: Secondary | ICD-10-CM | POA: Diagnosis not present

## 2022-01-09 DIAGNOSIS — K3189 Other diseases of stomach and duodenum: Secondary | ICD-10-CM | POA: Diagnosis not present

## 2022-01-19 DIAGNOSIS — K2980 Duodenitis without bleeding: Secondary | ICD-10-CM | POA: Diagnosis not present

## 2022-01-19 DIAGNOSIS — D123 Benign neoplasm of transverse colon: Secondary | ICD-10-CM | POA: Diagnosis not present

## 2022-01-19 DIAGNOSIS — K31A19 Gastric intestinal metaplasia without dysplasia, unspecified site: Secondary | ICD-10-CM | POA: Diagnosis not present

## 2022-01-19 DIAGNOSIS — K2940 Chronic atrophic gastritis without bleeding: Secondary | ICD-10-CM | POA: Diagnosis not present

## 2022-02-25 DIAGNOSIS — I10 Essential (primary) hypertension: Secondary | ICD-10-CM | POA: Diagnosis not present

## 2022-02-25 DIAGNOSIS — Z79899 Other long term (current) drug therapy: Secondary | ICD-10-CM | POA: Diagnosis not present

## 2022-02-25 DIAGNOSIS — R918 Other nonspecific abnormal finding of lung field: Secondary | ICD-10-CM | POA: Diagnosis not present

## 2022-02-25 DIAGNOSIS — Z0001 Encounter for general adult medical examination with abnormal findings: Secondary | ICD-10-CM | POA: Diagnosis not present

## 2022-08-04 IMAGING — MG MM DIGITAL SCREENING BILAT W/ TOMO AND CAD
8 series · 9 of 24 positions shown · non-contrast
Comparison: Previous exam(s).

CLINICAL DATA: Screening.

EXAM:
DIGITAL SCREENING BILATERAL MAMMOGRAM WITH TOMOSYNTHESIS AND CAD
TECHNIQUE: Bilateral screening digital craniocaudal and mediolateral oblique
mammograms were obtained. Bilateral screening digital breast
tomosynthesis was performed. The images were evaluated with
computer-aided detection.

[L CC synth-2D]
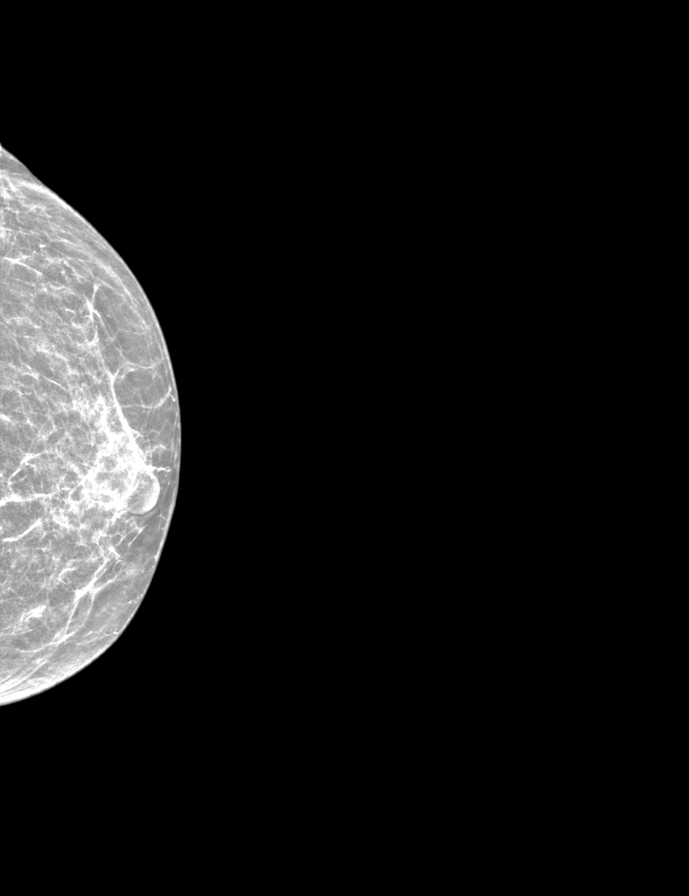

[R CC synth-2D]
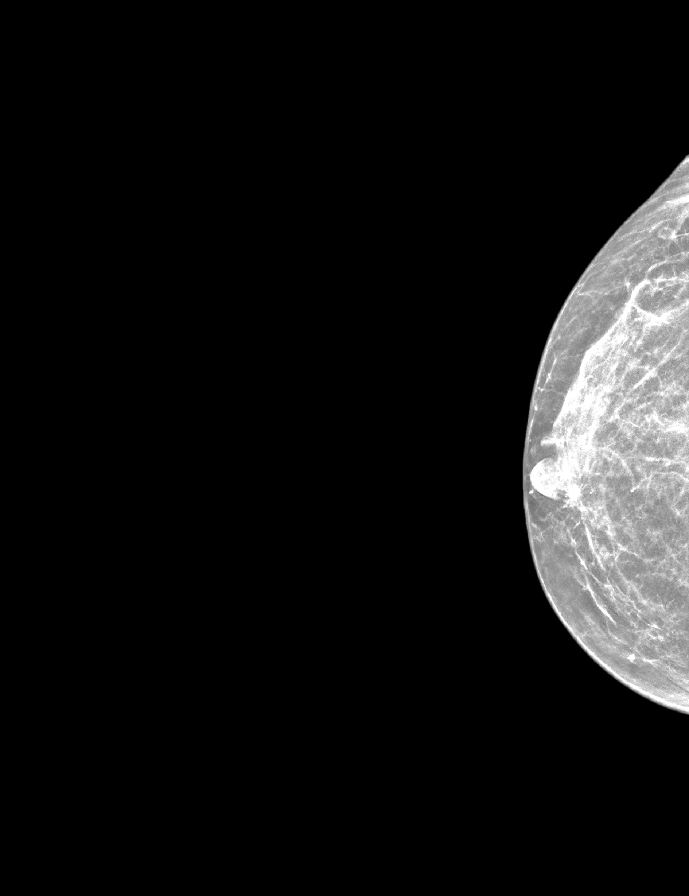

[L MLO synth-2D]
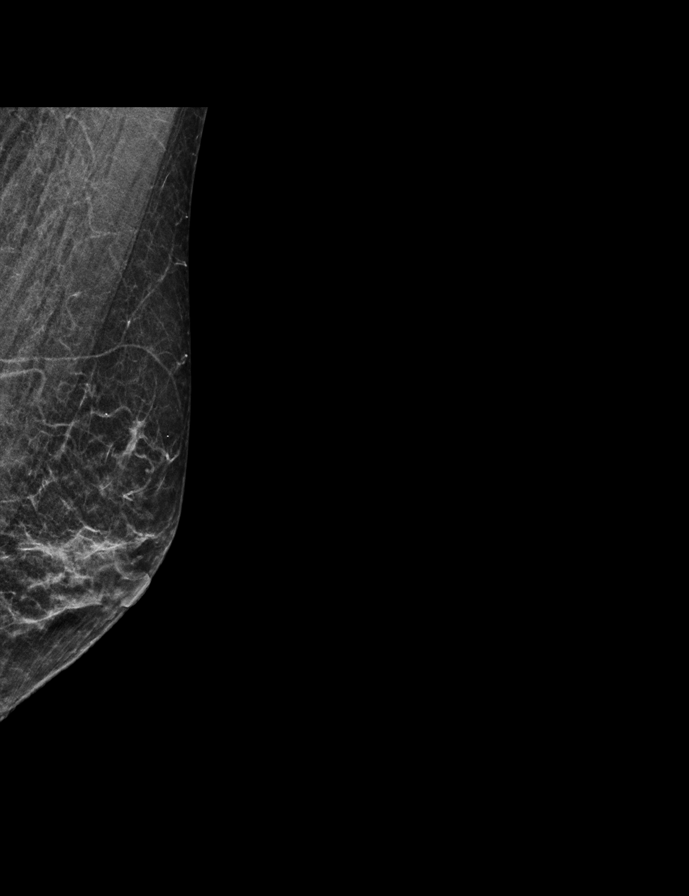

[R MLO synth-2D]
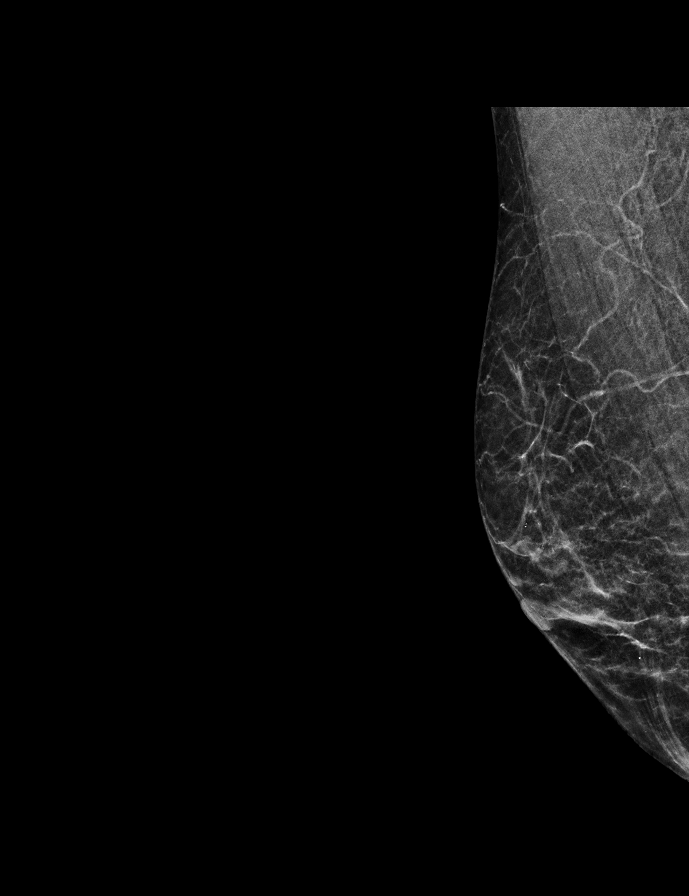

[L CC tomo · 2 of 31 frames shown]
[frame 11/31]
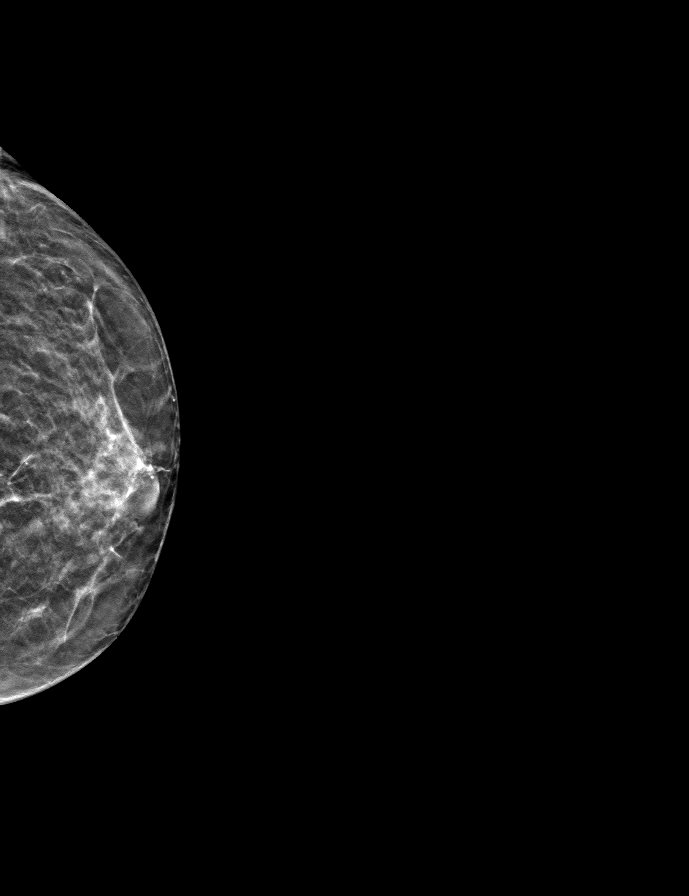
[frame 16/31]
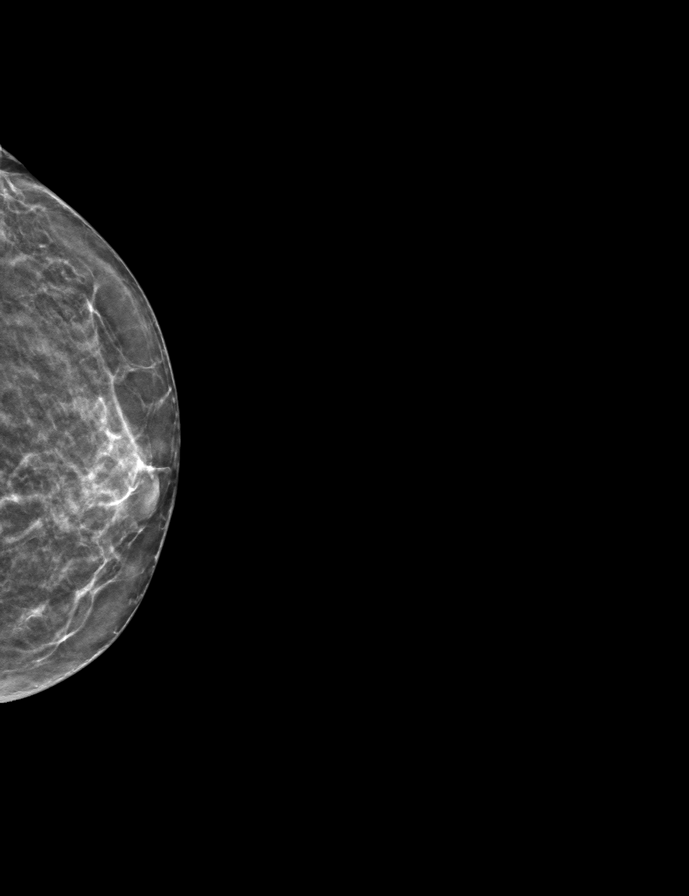

[R CC tomo · tomo slice 17/32.0]
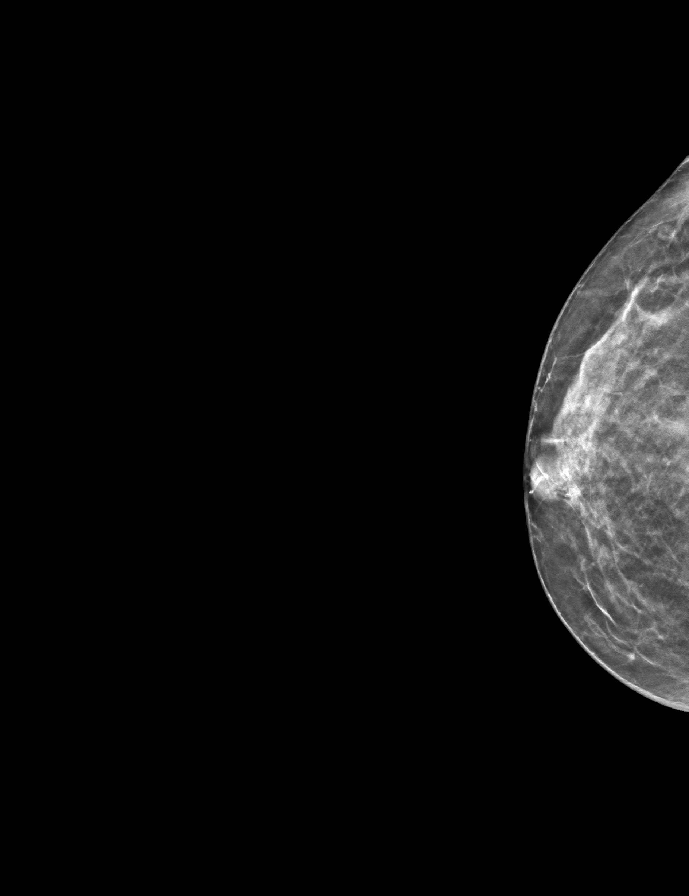

[L MLO tomo · tomo slice 16/31.0]
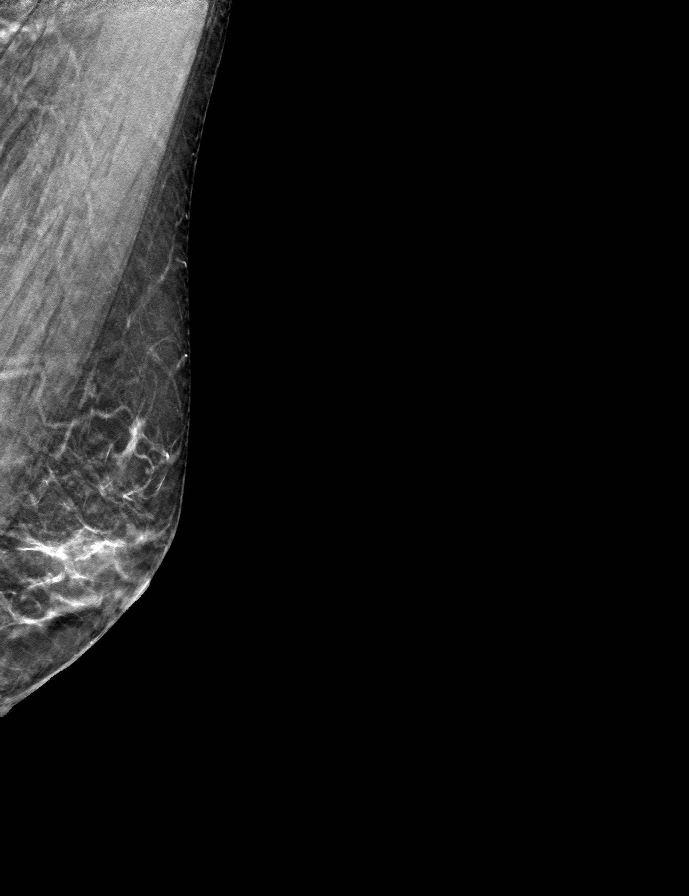

[R MLO tomo · tomo slice 15/30.0]
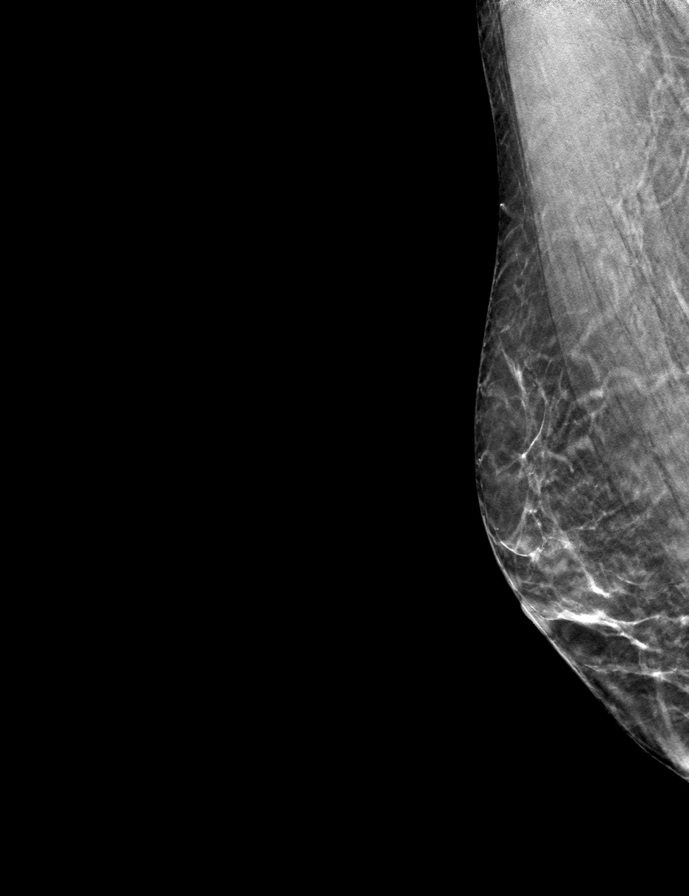

[9 of 24 positions shown; findings below may reference images not displayed]

ACR Breast Density Category b: There are scattered areas of
fibroglandular density.
FINDINGS: There are no findings suspicious for malignancy.
IMPRESSION: No mammographic evidence of malignancy. A result letter of this
screening mammogram will be mailed directly to the patient.

RECOMMENDATION:
Screening mammogram in one year. (Code:51-O-LD2)

BI-RADS CATEGORY  1: Negative.

## 2022-08-27 DIAGNOSIS — E440 Moderate protein-calorie malnutrition: Secondary | ICD-10-CM | POA: Diagnosis not present

## 2022-11-23 DIAGNOSIS — L659 Nonscarring hair loss, unspecified: Secondary | ICD-10-CM | POA: Diagnosis not present

## 2022-11-23 DIAGNOSIS — E559 Vitamin D deficiency, unspecified: Secondary | ICD-10-CM | POA: Diagnosis not present

## 2022-11-23 DIAGNOSIS — E46 Unspecified protein-calorie malnutrition: Secondary | ICD-10-CM | POA: Diagnosis not present

## 2022-11-23 DIAGNOSIS — Z136 Encounter for screening for cardiovascular disorders: Secondary | ICD-10-CM | POA: Diagnosis not present

## 2022-11-23 DIAGNOSIS — Z1159 Encounter for screening for other viral diseases: Secondary | ICD-10-CM | POA: Diagnosis not present

## 2022-11-23 DIAGNOSIS — Z79899 Other long term (current) drug therapy: Secondary | ICD-10-CM | POA: Diagnosis not present

## 2022-11-23 DIAGNOSIS — I10 Essential (primary) hypertension: Secondary | ICD-10-CM | POA: Diagnosis not present

## 2022-11-23 DIAGNOSIS — G4700 Insomnia, unspecified: Secondary | ICD-10-CM | POA: Diagnosis not present

## 2022-12-07 DIAGNOSIS — I10 Essential (primary) hypertension: Secondary | ICD-10-CM | POA: Diagnosis not present

## 2022-12-07 DIAGNOSIS — H18413 Arcus senilis, bilateral: Secondary | ICD-10-CM | POA: Diagnosis not present

## 2022-12-07 DIAGNOSIS — K4090 Unilateral inguinal hernia, without obstruction or gangrene, not specified as recurrent: Secondary | ICD-10-CM | POA: Diagnosis not present

## 2022-12-07 DIAGNOSIS — I700 Atherosclerosis of aorta: Secondary | ICD-10-CM | POA: Diagnosis not present

## 2022-12-07 DIAGNOSIS — G4700 Insomnia, unspecified: Secondary | ICD-10-CM | POA: Diagnosis not present

## 2022-12-07 DIAGNOSIS — K644 Residual hemorrhoidal skin tags: Secondary | ICD-10-CM | POA: Diagnosis not present

## 2022-12-07 DIAGNOSIS — Z0001 Encounter for general adult medical examination with abnormal findings: Secondary | ICD-10-CM | POA: Diagnosis not present

## 2022-12-07 DIAGNOSIS — J439 Emphysema, unspecified: Secondary | ICD-10-CM | POA: Diagnosis not present

## 2022-12-07 DIAGNOSIS — I771 Stricture of artery: Secondary | ICD-10-CM | POA: Diagnosis not present

## 2022-12-22 ENCOUNTER — Other Ambulatory Visit: Payer: Self-pay | Admitting: Nurse Practitioner

## 2022-12-22 ENCOUNTER — Encounter: Payer: Self-pay | Admitting: Family Medicine

## 2022-12-22 DIAGNOSIS — Z0000 Encounter for general adult medical examination without abnormal findings: Secondary | ICD-10-CM

## 2022-12-22 DIAGNOSIS — Z1231 Encounter for screening mammogram for malignant neoplasm of breast: Secondary | ICD-10-CM

## 2023-01-01 ENCOUNTER — Ambulatory Visit: Payer: 59

## 2023-01-01 ENCOUNTER — Ambulatory Visit: Payer: Medicare Other

## 2023-01-06 ENCOUNTER — Ambulatory Visit: Payer: 59

## 2023-02-18 ENCOUNTER — Ambulatory Visit
Admission: RE | Admit: 2023-02-18 | Discharge: 2023-02-18 | Disposition: A | Discharge status: Home / Self Care | Payer: 59 | Source: Ambulatory Visit | Attending: Nurse Practitioner | Admitting: Nurse Practitioner

## 2023-02-18 DIAGNOSIS — Z1231 Encounter for screening mammogram for malignant neoplasm of breast: Secondary | ICD-10-CM

## 2024-02-07 ENCOUNTER — Other Ambulatory Visit (HOSPITAL_BASED_OUTPATIENT_CLINIC_OR_DEPARTMENT_OTHER): Payer: Self-pay | Admitting: Nurse Practitioner

## 2024-02-07 DIAGNOSIS — E2839 Other primary ovarian failure: Secondary | ICD-10-CM

## 2024-02-14 ENCOUNTER — Ambulatory Visit: Admitting: Podiatry

## 2024-02-28 ENCOUNTER — Ambulatory Visit (INDEPENDENT_AMBULATORY_CARE_PROVIDER_SITE_OTHER): Admitting: Podiatry

## 2024-02-28 ENCOUNTER — Encounter: Payer: Self-pay | Admitting: Podiatry

## 2024-02-28 DIAGNOSIS — M79675 Pain in left toe(s): Secondary | ICD-10-CM | POA: Diagnosis not present

## 2024-02-28 DIAGNOSIS — M2041 Other hammer toe(s) (acquired), right foot: Secondary | ICD-10-CM

## 2024-02-28 DIAGNOSIS — M79674 Pain in right toe(s): Secondary | ICD-10-CM | POA: Diagnosis not present

## 2024-02-28 DIAGNOSIS — L84 Corns and callosities: Secondary | ICD-10-CM | POA: Diagnosis not present

## 2024-02-28 DIAGNOSIS — M2042 Other hammer toe(s) (acquired), left foot: Secondary | ICD-10-CM

## 2024-02-28 DIAGNOSIS — B351 Tinea unguium: Secondary | ICD-10-CM

## 2024-02-28 NOTE — Progress Notes (Unsigned)
 [facility] blood work

## 2024-03-15 ENCOUNTER — Telehealth: Payer: Self-pay | Admitting: Podiatry

## 2024-03-15 DIAGNOSIS — Z79899 Other long term (current) drug therapy: Secondary | ICD-10-CM

## 2024-03-15 MED ORDER — TERBINAFINE HCL 250 MG PO TABS: 250.0000 mg | ORAL_TABLET | Freq: Every day | ORAL | 0 refills | Status: AC

## 2024-03-15 NOTE — Telephone Encounter (Signed)
 Can you please let the patient know that I received her lab work and her liver function is normal.  Therefore I have sent the oral Lamisil , antifungal medicine, to the pharmacy for her.  If she has any side effects, issues with the medication to stop taking it and let us  know.  I like to recheck her blood work in 6 weeks to make sure her liver function stays normal as this medication can cause changes in her liver function.  I put a new order in for [facility] to get this blood work done but it is.  When she gets this done in 6 weeks.  Thank you!
